# Patient Record
Sex: Female | Born: 1960 | Race: Black or African American | Hispanic: No | Marital: Married | Smoking: Never smoker
Health system: Southern US, Community
[De-identification: ages and names within clinical notes are randomized; demographics above are authoritative.]

## PROBLEM LIST (undated history)

## (undated) DIAGNOSIS — IMO0002 Reserved for concepts with insufficient information to code with codable children: Secondary | ICD-10-CM

## (undated) DIAGNOSIS — Z8619 Personal history of other infectious and parasitic diseases: Secondary | ICD-10-CM

## (undated) DIAGNOSIS — I10 Essential (primary) hypertension: Secondary | ICD-10-CM

## (undated) DIAGNOSIS — E119 Type 2 diabetes mellitus without complications: Secondary | ICD-10-CM

## (undated) DIAGNOSIS — E7800 Pure hypercholesterolemia, unspecified: Secondary | ICD-10-CM

## (undated) DIAGNOSIS — Z87440 Personal history of urinary (tract) infections: Secondary | ICD-10-CM

## (undated) HISTORY — DX: Pure hypercholesterolemia, unspecified: E78.00

## (undated) HISTORY — DX: Personal history of urinary (tract) infections: Z87.440

## (undated) HISTORY — DX: Reserved for concepts with insufficient information to code with codable children: IMO0002

## (undated) HISTORY — DX: Personal history of other infectious and parasitic diseases: Z86.19

## (undated) HISTORY — DX: Essential (primary) hypertension: I10

## (undated) HISTORY — DX: Type 2 diabetes mellitus without complications: E11.9

---

## 2003-04-07 HISTORY — PX: CHOLECYSTECTOMY: SHX55

## 2004-01-15 ENCOUNTER — Emergency Department: Payer: Self-pay | Admitting: General Practice

## 2004-04-06 HISTORY — PX: ABDOMINAL HYSTERECTOMY: SHX81

## 2004-07-16 ENCOUNTER — Emergency Department: Payer: Self-pay | Admitting: Emergency Medicine

## 2004-07-18 ENCOUNTER — Inpatient Hospital Stay: Payer: Self-pay | Admitting: Internal Medicine

## 2004-08-22 ENCOUNTER — Ambulatory Visit: Payer: Self-pay | Admitting: Internal Medicine

## 2004-11-18 ENCOUNTER — Emergency Department: Payer: Self-pay | Admitting: Emergency Medicine

## 2005-05-12 ENCOUNTER — Ambulatory Visit: Payer: Self-pay | Admitting: Internal Medicine

## 2005-06-20 ENCOUNTER — Emergency Department: Payer: Self-pay | Admitting: Emergency Medicine

## 2006-07-28 ENCOUNTER — Ambulatory Visit: Payer: Self-pay | Admitting: Internal Medicine

## 2006-12-30 ENCOUNTER — Emergency Department: Payer: Self-pay

## 2007-01-07 ENCOUNTER — Ambulatory Visit: Payer: Self-pay | Admitting: Internal Medicine

## 2007-08-25 ENCOUNTER — Ambulatory Visit: Payer: Self-pay | Admitting: Internal Medicine

## 2007-09-06 ENCOUNTER — Ambulatory Visit: Payer: Self-pay | Admitting: Internal Medicine

## 2007-10-12 ENCOUNTER — Ambulatory Visit: Payer: Self-pay | Admitting: Internal Medicine

## 2007-10-14 ENCOUNTER — Ambulatory Visit: Payer: Self-pay | Admitting: Internal Medicine

## 2009-10-30 ENCOUNTER — Ambulatory Visit: Payer: Self-pay | Admitting: Internal Medicine

## 2010-04-06 HISTORY — PX: BREAST SURGERY: SHX581

## 2010-04-06 HISTORY — PX: BREAST BIOPSY: SHX20

## 2010-11-20 ENCOUNTER — Ambulatory Visit: Payer: Self-pay | Admitting: Family Medicine

## 2010-11-25 ENCOUNTER — Ambulatory Visit: Payer: Self-pay | Admitting: Family Medicine

## 2011-01-05 ENCOUNTER — Ambulatory Visit: Payer: Self-pay | Admitting: Surgery

## 2012-01-28 ENCOUNTER — Emergency Department: Payer: Self-pay | Admitting: Emergency Medicine

## 2012-01-28 LAB — URINALYSIS, COMPLETE
Bacteria: NONE SEEN
Bilirubin,UR: NEGATIVE
Blood: NEGATIVE
Glucose,UR: 150 mg/dL (ref 0–75)
Ketone: NEGATIVE
Nitrite: NEGATIVE
Protein: NEGATIVE
RBC,UR: 7 /HPF (ref 0–5)
Specific Gravity: 1.018 (ref 1.003–1.030)
Squamous Epithelial: 3
WBC UR: 7 /HPF (ref 0–5)

## 2012-01-28 LAB — CBC
HCT: 40.7 % (ref 35.0–47.0)
MCH: 28 pg (ref 26.0–34.0)
Platelet: 212 10*3/uL (ref 150–440)
RBC: 4.77 10*6/uL (ref 3.80–5.20)
RDW: 12.6 % (ref 11.5–14.5)

## 2012-01-28 LAB — TROPONIN I: Troponin-I: <0.02 ng/mL

## 2012-01-28 LAB — COMPREHENSIVE METABOLIC PANEL
Anion Gap: 6 — ABNORMAL LOW (ref 7–16)
Bilirubin,Total: 0.3 mg/dL (ref 0.2–1.0)
Chloride: 104 mmol/L (ref 98–107)
Co2: 29 mmol/L (ref 21–32)
Creatinine: 0.99 mg/dL (ref 0.60–1.30)
EGFR (African American): >60
EGFR (Non-African Amer.): >60
Glucose: 225 mg/dL — ABNORMAL HIGH (ref 65–99)
Osmolality: 284 (ref 275–301)
Potassium: 3.9 mmol/L (ref 3.5–5.1)
SGOT(AST): 31 U/L (ref 15–37)
SGPT (ALT): 49 U/L (ref 12–78)
Sodium: 139 mmol/L (ref 136–145)
Total Protein: 8 g/dL (ref 6.4–8.2)

## 2012-01-28 LAB — CK TOTAL AND CKMB (NOT AT ARMC)
CK, Total: 144 U/L (ref 21–215)
CK, Total: 139 U/L (ref 21–215)
CK-MB: 0.8 ng/mL (ref 0.5–3.6)
CK-MB: 0.9 ng/mL (ref 0.5–3.6)

## 2012-04-06 HISTORY — PX: LEG SURGERY: SHX1003

## 2012-04-08 ENCOUNTER — Ambulatory Visit: Payer: Self-pay | Admitting: Surgery

## 2012-04-08 LAB — CBC WITH DIFFERENTIAL/PLATELET
Basophil #: 0 10*3/uL (ref 0.0–0.1)
HGB: 14.7 g/dL (ref 12.0–16.0)
Lymphocyte #: 1.1 10*3/uL (ref 1.0–3.6)
Lymphocyte %: 18.7 %
MCH: 28.4 pg (ref 26.0–34.0)
MCHC: 33.8 g/dL (ref 32.0–36.0)
MCV: 84 fL (ref 80–100)
Monocyte #: 0.9 x10 3/mm (ref 0.2–0.9)
Neutrophil #: 3.6 10*3/uL (ref 1.4–6.5)
RBC: 5.17 10*6/uL (ref 3.80–5.20)
WBC: 5.8 10*3/uL (ref 3.6–11.0)

## 2012-04-08 LAB — BASIC METABOLIC PANEL
BUN: 17 mg/dL (ref 7–18)
Calcium, Total: 9.3 mg/dL (ref 8.5–10.1)
Chloride: 96 mmol/L — ABNORMAL LOW (ref 98–107)
Co2: 27 mmol/L (ref 21–32)
Creatinine: 1.53 mg/dL — ABNORMAL HIGH (ref 0.60–1.30)
EGFR (African American): 46 — ABNORMAL LOW
EGFR (Non-African Amer.): 40 — ABNORMAL LOW
Osmolality: 272 (ref 275–301)
Potassium: 4.7 mmol/L (ref 3.5–5.1)

## 2012-04-12 LAB — WOUND CULTURE

## 2012-06-01 ENCOUNTER — Ambulatory Visit: Payer: Self-pay | Admitting: Orthopedic Surgery

## 2012-06-17 ENCOUNTER — Ambulatory Visit: Payer: Self-pay | Admitting: Family Medicine

## 2012-07-05 ENCOUNTER — Ambulatory Visit: Payer: Self-pay | Admitting: Family Medicine

## 2012-08-04 ENCOUNTER — Ambulatory Visit: Payer: Self-pay | Admitting: Family Medicine

## 2012-08-15 ENCOUNTER — Ambulatory Visit: Payer: Self-pay | Admitting: Family Medicine

## 2012-11-15 ENCOUNTER — Encounter: Payer: Self-pay | Admitting: Internal Medicine

## 2012-11-15 ENCOUNTER — Ambulatory Visit (INDEPENDENT_AMBULATORY_CARE_PROVIDER_SITE_OTHER): Payer: 59 | Admitting: Internal Medicine

## 2012-11-15 VITALS — BP 108/80 | HR 71 | Temp 98.1°F | Ht 63.5 in | Wt 274.0 lb

## 2012-11-15 DIAGNOSIS — M25512 Pain in left shoulder: Secondary | ICD-10-CM

## 2012-11-15 DIAGNOSIS — M25519 Pain in unspecified shoulder: Secondary | ICD-10-CM

## 2012-11-15 DIAGNOSIS — E119 Type 2 diabetes mellitus without complications: Secondary | ICD-10-CM

## 2012-11-15 DIAGNOSIS — I10 Essential (primary) hypertension: Secondary | ICD-10-CM

## 2012-11-15 DIAGNOSIS — E7800 Pure hypercholesterolemia, unspecified: Secondary | ICD-10-CM

## 2012-11-15 MED ORDER — NYSTATIN 100000 UNIT/GM EX CREA: TOPICAL_CREAM | Freq: Two times a day (BID) | CUTANEOUS | Status: DC

## 2012-11-17 ENCOUNTER — Encounter: Payer: Self-pay | Admitting: Internal Medicine

## 2012-11-17 DIAGNOSIS — E7800 Pure hypercholesterolemia, unspecified: Secondary | ICD-10-CM | POA: Insufficient documentation

## 2012-11-17 DIAGNOSIS — E119 Type 2 diabetes mellitus without complications: Secondary | ICD-10-CM | POA: Insufficient documentation

## 2012-11-17 DIAGNOSIS — M25519 Pain in unspecified shoulder: Secondary | ICD-10-CM | POA: Insufficient documentation

## 2012-11-17 DIAGNOSIS — I10 Essential (primary) hypertension: Secondary | ICD-10-CM | POA: Insufficient documentation

## 2012-11-17 NOTE — Assessment & Plan Note (Signed)
Blood pressure under good control.  Same medication regimen.  Check metabolic panel.    

## 2012-11-17 NOTE — Assessment & Plan Note (Signed)
Sugars appear to be out of control.  Brought in no recorded sugar readings.  Will check metabolic panel and a1c.  Keep up to date with eye checks.  Review records.  See when last microalbumin/cr ratio checked.  Record sugars.  Get her back in soon to reassess.

## 2012-11-17 NOTE — Assessment & Plan Note (Signed)
Low cholesterol diet.  Check lipid panel.    

## 2012-11-17 NOTE — Progress Notes (Signed)
Subjective:    Patient ID: Denise Mathis, female    DOB: [birth year], 52 y.o.   MRN: [id number]  HPI 52 year old female with past history of diabetes, hypertension and hypercholesterolemia who comes in today to follow up on these issues as well as to establish care.  She has previously been seeing Dr Kathrene Alu and Dr Marguerite Olea.  Diagnosed with diabetes at age 51.  Has been to [facility].  States when first diagnosed, a1c 13.  Last check that she is aware of - a1c 8.0.  Is currently on metformin and Januvia.  Not able to increase metformin secondary to increased diarrhea.  She stopped the actos.  Had not been watching what she eats as well and not exercising.  States AM sugars averaging 170 and PM 200-250.  Did not bring in any recorded sugar readings.  She had a cyst on her right upper thigh.  Had cellulitis.  Was evaluated at [facility].  Had drained.  Doing better.  Still with increased leg pain.  Was evaluated at [facility].  Had xray.  Told inflamed.  Given prednisone.  Better.  Does have increased shoulder pain and limited rom now.  Takes advil - sometimes 6/day.     Past Medical History  Diagnosis Date  . History of chicken pox   . Diabetes mellitus without complication   . Ulcer   . Hypertension   . Hypercholesterolemia   . Hx: UTI (urinary tract infection)     Outpatient Encounter Prescriptions as of 11/15/2012  Medication Sig Dispense Refill  . ibuprofen (ADVIL,MOTRIN) 200 MG tablet Take 200 mg by mouth every 6 (six) hours as needed for pain.      Marland Kitchen lisinopril (PRINIVIL,ZESTRIL) 10 MG tablet Take 10 mg by mouth daily.      . metFORMIN (GLUCOPHAGE) 500 MG tablet Take 500 mg by mouth 2 (two) times daily with a meal.      . Naproxen Sodium (ALEVE PO) Take by mouth as needed.      . nystatin cream (MYCOSTATIN) Apply topically 2 (two) times daily.  30 g  0  . sitaGLIPtin (JANUVIA) 100 MG tablet Take 100 mg by mouth daily.       No facility-administered encounter medications on file as  of 11/15/2012.    Review of Systems Patient denies any headache, lightheadedness or dizziness.  No sinus or allergy symptoms.  No chest pain, tightness or palpitations.  No increased shortness of breath, cough or congestion.  No nausea or vomiting.  No acid reflux.  No abdominal pain or cramping.  No bowel change, such as diarrhea, constipation, BRBPR or melana.  No urine change.  Sugars as outlined.  Shoulder discomfort and limited rom as outlined.       Objective:   Physical Exam Filed Vitals:   11/15/12 1514  BP: 108/80  Pulse: 71  Temp: 98.1 F (36.7 C)   Blood pressure recheck:  40/50  51 year old female in no acute distress.   HEENT:  Nares- clear.  Oropharynx - without lesions. NECK:  Supple.  Nontender.  No audible bruit.  HEART:  Appears to be regular. LUNGS:  No crackles or wheezing audible.  Respirations even and unlabored.  RADIAL PULSE:  Equal bilaterally.    ABDOMEN:  Soft, nontender.  Bowel sounds present and normal.  No audible abdominal bruit.   EXTREMITIES:  No increased edema present.  DP pulses palpable and equal bilaterally.      FEET:  No lesions.  Assessment & Plan:  HEALTH MAINTENANCE.  Will schedule her for a physical when due.  Obtain outside records for review.    I spent 45 minutes with the patient and more than 50% of the time was spent in consultation regarding the above.

## 2012-11-17 NOTE — Assessment & Plan Note (Signed)
Increased shoulder pain.  Limited rom.  Will xray.  May need referral to ortho and PT.

## 2012-11-21 ENCOUNTER — Ambulatory Visit (INDEPENDENT_AMBULATORY_CARE_PROVIDER_SITE_OTHER)
Admission: RE | Admit: 2012-11-21 | Discharge: 2012-11-21 | Disposition: A | Discharge status: Home / Self Care | Payer: 59 | Source: Ambulatory Visit | Attending: Internal Medicine | Admitting: Internal Medicine

## 2012-11-21 DIAGNOSIS — M25512 Pain in left shoulder: Secondary | ICD-10-CM

## 2012-11-21 DIAGNOSIS — M25519 Pain in unspecified shoulder: Secondary | ICD-10-CM

## 2012-11-22 ENCOUNTER — Encounter: Payer: Self-pay | Admitting: Internal Medicine

## 2012-11-23 ENCOUNTER — Encounter: Payer: Self-pay | Admitting: Internal Medicine

## 2012-11-23 DIAGNOSIS — M25512 Pain in left shoulder: Secondary | ICD-10-CM

## 2012-11-24 ENCOUNTER — Encounter: Payer: Self-pay | Admitting: Internal Medicine

## 2012-11-24 NOTE — Telephone Encounter (Signed)
Order placed for referral to ortho 

## 2012-11-29 ENCOUNTER — Encounter: Payer: Self-pay | Admitting: Internal Medicine

## 2012-11-29 ENCOUNTER — Encounter: Payer: Self-pay | Admitting: *Deleted

## 2012-11-30 ENCOUNTER — Encounter: Payer: Self-pay | Admitting: Internal Medicine

## 2012-12-01 ENCOUNTER — Encounter: Payer: Self-pay | Admitting: Internal Medicine

## 2012-12-07 ENCOUNTER — Encounter: Payer: Self-pay | Admitting: Internal Medicine

## 2012-12-07 ENCOUNTER — Ambulatory Visit (INDEPENDENT_AMBULATORY_CARE_PROVIDER_SITE_OTHER): Payer: 59 | Admitting: Internal Medicine

## 2012-12-07 ENCOUNTER — Other Ambulatory Visit: Payer: Self-pay | Admitting: Orthopedic Surgery

## 2012-12-07 VITALS — BP 118/76 | HR 63 | Temp 98.2°F | Resp 12 | Ht 63.5 in | Wt 273.0 lb

## 2012-12-07 DIAGNOSIS — M25512 Pain in left shoulder: Secondary | ICD-10-CM

## 2012-12-07 DIAGNOSIS — E119 Type 2 diabetes mellitus without complications: Secondary | ICD-10-CM

## 2012-12-07 DIAGNOSIS — I10 Essential (primary) hypertension: Secondary | ICD-10-CM

## 2012-12-07 DIAGNOSIS — E7800 Pure hypercholesterolemia, unspecified: Secondary | ICD-10-CM

## 2012-12-07 DIAGNOSIS — M25519 Pain in unspecified shoulder: Secondary | ICD-10-CM

## 2012-12-07 MED ORDER — GLIPIZIDE ER 5 MG PO TB24: 5.0000 mg | ORAL_TABLET | Freq: Every day | ORAL | Status: DC

## 2012-12-09 ENCOUNTER — Other Ambulatory Visit: Payer: 59

## 2012-12-10 ENCOUNTER — Encounter: Payer: Self-pay | Admitting: Internal Medicine

## 2012-12-10 NOTE — Assessment & Plan Note (Signed)
A1c just checked 11/17/12 - 11.9.  Unable to increase metformin.  She is on Januvia.  Previously had problems with lows - with sulfonylurea.  Discussed starting Victoza or Byetta.  Discussed side effect of weight loss.  She declines to start.  Does not want to give herself an injection.  Will start Glucotrol XL 5mg  q day.  Adjust diet and exercise as we discussed.  She has been to [facility].  Does not feel she needs a refresher.  Check blood sugar and record at least bid.  Get her back in soon to reassess.  Keep up to date with eye checks.

## 2012-12-10 NOTE — Assessment & Plan Note (Signed)
Increased shoulder pain.  Limited rom.  Xray per report.  Referred to ortho for further evaluation and treatment.

## 2012-12-10 NOTE — Assessment & Plan Note (Signed)
Blood pressure under good control.  Same medication regimen.  Follow metabolic panel.   

## 2012-12-10 NOTE — Progress Notes (Signed)
  Subjective:    Patient ID: Denise Mathis, female    DOB: 05/07/1960, 52 y.o.   MRN: [id number]  HPI 52 year old female with past history of diabetes, hypertension and hypercholesterolemia who comes in today for a scheduled follow up.  Diagnosed with diabetes at age 54.  Has been to [facility].  Is currently on metformin and Januvia.  Not able to increase metformin secondary to increased diarrhea. She stopped the actos.  Since her last visit ,she has been watching what she eats.  Has cut out some sweets and drinking water, etc.  Plans to start exercising more.  Breathing stable.  No cardiac symptoms  With increased activity or exertion.      Past Medical History  Diagnosis Date  . History of chicken pox   . Diabetes mellitus without complication   . Ulcer   . Hypertension   . Hypercholesterolemia   . Hx: UTI (urinary tract infection)     Outpatient Encounter Prescriptions as of 12/07/2012  Medication Sig Dispense Refill  . ibuprofen (ADVIL,MOTRIN) 200 MG tablet Take 200 mg by mouth every 6 (six) hours as needed for pain.      Marland Kitchen lisinopril (PRINIVIL,ZESTRIL) 10 MG tablet Take 10 mg by mouth daily.      . metFORMIN (GLUCOPHAGE) 500 MG tablet Take 500 mg by mouth 2 (two) times daily with a meal.      . nystatin cream (MYCOSTATIN) Apply topically 2 (two) times daily.  30 g  0  . sitaGLIPtin (JANUVIA) 100 MG tablet Take 100 mg by mouth daily.      Marland Kitchen glipiZIDE (GLUCOTROL XL) 5 MG 24 hr tablet Take 1 tablet (5 mg total) by mouth daily.  30 tablet  2  . [DISCONTINUED] Naproxen Sodium (ALEVE PO) Take by mouth as needed.       No facility-administered encounter medications on file as of 12/07/2012.    Review of Systems Patient denies any headache, lightheadedness or dizziness.  No sinus or allergy symptoms.  No chest pain, tightness or palpitations.  No increased shortness of breath, cough or congestion.  No nausea or vomiting.  No acid reflux.  No abdominal pain or cramping.  No bowel change, such  as diarrhea, constipation, BRBPR or melana.  No urine change.  Brought in no recorded sugar readings.  She states still elevated, but have started coming down.  She has adjusted her diet.       Objective:   Physical Exam  Filed Vitals:   12/07/12 1211  BP: 118/76  Pulse: 63  Temp: 98.2 F (36.8 C)  Resp: 81   52 year old female in no acute distress.   HEENT:  Nares- clear.  Oropharynx - without lesions. NECK:  Supple.  Nontender.  No audible bruit.  HEART:  Appears to be regular. LUNGS:  No crackles or wheezing audible.  Respirations even and unlabored.  RADIAL PULSE:  Equal bilaterally.    ABDOMEN:  Soft, nontender.  Bowel sounds present and normal.  No audible abdominal bruit.   EXTREMITIES:  No increased edema present.  DP pulses palpable and equal bilaterally.      FEET:  No lesions.      Assessment & Plan:  HEALTH MAINTENANCE.  Will schedule her for a physical when due.  Obtain outside records for review.

## 2012-12-10 NOTE — Assessment & Plan Note (Signed)
Low cholesterol diet.  Cholesterol just checked 11/21/12 revealed total cholesterol 183, triglycerides 119, HDL 46 and LDL 113.

## 2012-12-16 ENCOUNTER — Encounter: Payer: Self-pay | Admitting: *Deleted

## 2012-12-17 ENCOUNTER — Ambulatory Visit
Admission: RE | Admit: 2012-12-17 | Discharge: 2012-12-17 | Disposition: A | Discharge status: Home / Self Care | Payer: 59 | Source: Ambulatory Visit | Attending: Orthopedic Surgery | Admitting: Orthopedic Surgery

## 2012-12-17 DIAGNOSIS — M25512 Pain in left shoulder: Secondary | ICD-10-CM

## 2012-12-20 ENCOUNTER — Telehealth: Payer: Self-pay | Admitting: *Deleted

## 2012-12-20 ENCOUNTER — Encounter: Payer: Self-pay | Admitting: Internal Medicine

## 2012-12-20 DIAGNOSIS — R945 Abnormal results of liver function studies: Secondary | ICD-10-CM

## 2012-12-20 NOTE — Telephone Encounter (Signed)
Pt would like to proceed with Abdominal Ultrasound (based on labs)

## 2012-12-20 NOTE — Telephone Encounter (Signed)
Order placed for abdominal ultrasound.   

## 2012-12-23 ENCOUNTER — Ambulatory Visit: Payer: Self-pay | Admitting: Internal Medicine

## 2012-12-28 ENCOUNTER — Telehealth: Payer: Self-pay | Admitting: Internal Medicine

## 2012-12-28 DIAGNOSIS — R160 Hepatomegaly, not elsewhere classified: Secondary | ICD-10-CM

## 2012-12-28 DIAGNOSIS — K760 Fatty (change of) liver, not elsewhere classified: Secondary | ICD-10-CM

## 2012-12-30 NOTE — Telephone Encounter (Signed)
Order placed for referral to GI 

## 2013-01-03 ENCOUNTER — Encounter: Payer: Self-pay | Admitting: Internal Medicine

## 2013-01-04 ENCOUNTER — Encounter: Payer: Self-pay | Admitting: Internal Medicine

## 2013-01-04 ENCOUNTER — Ambulatory Visit (INDEPENDENT_AMBULATORY_CARE_PROVIDER_SITE_OTHER): Payer: 59 | Admitting: Internal Medicine

## 2013-01-04 VITALS — BP 110/70 | HR 72 | Temp 98.2°F | Ht 63.5 in | Wt 265.5 lb

## 2013-01-04 DIAGNOSIS — I10 Essential (primary) hypertension: Secondary | ICD-10-CM

## 2013-01-04 DIAGNOSIS — R945 Abnormal results of liver function studies: Secondary | ICD-10-CM

## 2013-01-04 DIAGNOSIS — E7800 Pure hypercholesterolemia, unspecified: Secondary | ICD-10-CM

## 2013-01-04 DIAGNOSIS — E119 Type 2 diabetes mellitus without complications: Secondary | ICD-10-CM

## 2013-01-04 DIAGNOSIS — M25519 Pain in unspecified shoulder: Secondary | ICD-10-CM

## 2013-01-04 DIAGNOSIS — R7989 Other specified abnormal findings of blood chemistry: Secondary | ICD-10-CM

## 2013-01-04 MED ORDER — LISINOPRIL 10 MG PO TABS: 10.0000 mg | ORAL_TABLET | Freq: Every day | ORAL | Status: DC

## 2013-01-04 MED ORDER — GLIPIZIDE ER 5 MG PO TB24: 5.0000 mg | ORAL_TABLET | Freq: Every day | ORAL | Status: DC

## 2013-01-04 MED ORDER — SITAGLIPTIN PHOSPHATE 100 MG PO TABS: 100.0000 mg | ORAL_TABLET | Freq: Every day | ORAL | Status: DC

## 2013-01-04 MED ORDER — METFORMIN HCL 500 MG PO TABS: 500.0000 mg | ORAL_TABLET | Freq: Two times a day (BID) | ORAL | Status: DC

## 2013-01-04 NOTE — Progress Notes (Signed)
  Subjective:    Patient ID: Denise Mathis, female    DOB: 1961-03-12, 52 y.o.   MRN: [id number]  HPI 52 year old female with past history of diabetes, hypertension and hypercholesterolemia who comes in today for a scheduled follow up.  Diagnosed with diabetes at age 17.  Has been to [facility].  Since her last visit ,she has been watching what she eats.  Exercising.  Losing weight.  Feels better.  sugars have improved significantly.  See attached list for details.  AM sugars averaging 90s-120 and PM sugars 120-140s.   Breathing stable.  No cardiac symptoms  With increased activity or exertion.  Saw ortho regarding her shoulder.  MRI performed.  See report for details.  Rotator cuff problems.  planning to start physical therapy next week.  Is s/p injection.  Pain is better.      Past Medical History  Diagnosis Date  . History of chicken pox   . Diabetes mellitus without complication   . Ulcer   . Hypertension   . Hypercholesterolemia   . Hx: UTI (urinary tract infection)     Outpatient Encounter Prescriptions as of 01/04/2013  Medication Sig Dispense Refill  . glipiZIDE (GLUCOTROL XL) 5 MG 24 hr tablet Take 1 tablet (5 mg total) by mouth daily.  30 tablet  2  . lisinopril (PRINIVIL,ZESTRIL) 10 MG tablet Take 10 mg by mouth daily.      . metFORMIN (GLUCOPHAGE) 500 MG tablet Take 500 mg by mouth 2 (two) times daily with a meal.      . nystatin cream (MYCOSTATIN) Apply topically 2 (two) times daily.  30 g  0  . sitaGLIPtin (JANUVIA) 100 MG tablet Take 100 mg by mouth daily.      . [DISCONTINUED] ibuprofen (ADVIL,MOTRIN) 200 MG tablet Take 200 mg by mouth every 6 (six) hours as needed for pain.       No facility-administered encounter medications on file as of 01/04/2013.    Review of Systems Patient denies any headache, lightheadedness or dizziness.  No sinus or allergy symptoms.  No chest pain, tightness or palpitations.  No increased shortness of breath, cough or congestion.  No nausea or  vomiting.  No acid reflux.  No abdominal pain or cramping.  No bowel change, such as diarrhea, constipation, BRBPR or melana.  No urine change.  Brought in recorded sugar readings.  Improved.  See attached list for details.   She has adjusted her diet.  Exercising.  Losing weight.  Shoulder is better.       Objective:   Physical Exam  Filed Vitals:   01/04/13 1009  BP: 110/70  Pulse: 72  Temp: 98.2 F (46.45 C)   52 year old female in no acute distress.   HEENT:  Nares- clear.  Oropharynx - without lesions. NECK:  Supple.  Nontender.  No audible bruit.  HEART:  Appears to be regular. LUNGS:  No crackles or wheezing audible.  Respirations even and unlabored.  RADIAL PULSE:  Equal bilaterally.    ABDOMEN:  Soft, nontender.  Bowel sounds present and normal.  No audible abdominal bruit.   EXTREMITIES:  No increased edema present.  DP pulses palpable and equal bilaterally.      FEET:  No lesions.      Assessment & Plan:  HEALTH MAINTENANCE.  Will schedule her for a physical when due. Still need outside records.

## 2013-01-04 NOTE — Assessment & Plan Note (Signed)
Low cholesterol diet.  Cholesterol just checked 11/21/12 revealed total cholesterol 183, triglycerides 119, HDL 46 and LDL 113.

## 2013-01-04 NOTE — Assessment & Plan Note (Addendum)
Seeing ortho.  S/p injection.  Pain is better.  Planning for physical therapy.  Doing exercises at home.

## 2013-01-04 NOTE — Assessment & Plan Note (Signed)
Blood pressure under good control.  Same medication regimen.  Follow metabolic panel.   

## 2013-01-04 NOTE — Assessment & Plan Note (Addendum)
Sugars improved significantly.  See her attached list.  Is watching what she eats and exercising.  Feels better.  Had her eyes checked in 4/14.  Same medication regimen.  Follow metabolic panel and a1c.

## 2013-01-05 ENCOUNTER — Encounter: Payer: Self-pay | Admitting: Internal Medicine

## 2013-01-05 DIAGNOSIS — R945 Abnormal results of liver function studies: Secondary | ICD-10-CM | POA: Insufficient documentation

## 2013-01-05 NOTE — Assessment & Plan Note (Signed)
Liver function elevated but stable.  Ultrasound revealed fatty liver and some hepatomegaly.  Referred to GI for further evaluation and treatment.

## 2013-01-09 ENCOUNTER — Other Ambulatory Visit: Payer: Self-pay | Admitting: *Deleted

## 2013-01-09 ENCOUNTER — Encounter: Payer: Self-pay | Admitting: Internal Medicine

## 2013-01-09 MED ORDER — GLUCOSE BLOOD VI STRP: ORAL_STRIP | Status: AC

## 2013-01-09 MED ORDER — ONETOUCH LANCETS MISC: Status: AC

## 2013-02-22 ENCOUNTER — Encounter: Payer: Self-pay | Admitting: Emergency Medicine

## 2013-03-09 ENCOUNTER — Ambulatory Visit: Payer: 59 | Admitting: Internal Medicine

## 2013-04-20 ENCOUNTER — Ambulatory Visit (INDEPENDENT_AMBULATORY_CARE_PROVIDER_SITE_OTHER): Payer: 59 | Admitting: Internal Medicine

## 2013-04-20 ENCOUNTER — Telehealth: Payer: Self-pay | Admitting: *Deleted

## 2013-04-20 ENCOUNTER — Encounter: Payer: Self-pay | Admitting: Internal Medicine

## 2013-04-20 VITALS — BP 120/70 | HR 86 | Temp 98.2°F | Ht 63.5 in | Wt 268.5 lb

## 2013-04-20 DIAGNOSIS — E119 Type 2 diabetes mellitus without complications: Secondary | ICD-10-CM

## 2013-04-20 DIAGNOSIS — R5381 Other malaise: Secondary | ICD-10-CM

## 2013-04-20 DIAGNOSIS — I10 Essential (primary) hypertension: Secondary | ICD-10-CM

## 2013-04-20 DIAGNOSIS — R7989 Other specified abnormal findings of blood chemistry: Secondary | ICD-10-CM

## 2013-04-20 DIAGNOSIS — M25519 Pain in unspecified shoulder: Secondary | ICD-10-CM

## 2013-04-20 DIAGNOSIS — E7800 Pure hypercholesterolemia, unspecified: Secondary | ICD-10-CM

## 2013-04-20 DIAGNOSIS — R112 Nausea with vomiting, unspecified: Secondary | ICD-10-CM

## 2013-04-20 DIAGNOSIS — R945 Abnormal results of liver function studies: Secondary | ICD-10-CM

## 2013-04-20 DIAGNOSIS — R5383 Other fatigue: Secondary | ICD-10-CM

## 2013-04-20 LAB — CBC WITH DIFFERENTIAL/PLATELET
Basophils Absolute: 0 10*3/uL (ref 0.0–0.1)
Basophils Relative: 0.3 % (ref 0.0–3.0)
Eosinophils Absolute: 0.1 10*3/uL (ref 0.0–0.7)
Eosinophils Relative: 1.6 % (ref 0.0–5.0)
HEMATOCRIT: 40.3 % (ref 36.0–46.0)
Hemoglobin: 13.3 g/dL (ref 12.0–15.0)
LYMPHS ABS: 2.5 10*3/uL (ref 0.7–4.0)
Lymphocytes Relative: 44.5 % (ref 12.0–46.0)
MCHC: 33.1 g/dL (ref 30.0–36.0)
MCV: 84.9 fl (ref 78.0–100.0)
MONO ABS: 0.4 10*3/uL (ref 0.1–1.0)
Monocytes Relative: 7.3 % (ref 3.0–12.0)
Neutro Abs: 2.6 10*3/uL (ref 1.4–7.7)
Neutrophils Relative %: 46.3 % (ref 43.0–77.0)
PLATELETS: 214 10*3/uL (ref 150.0–400.0)
RBC: 4.75 Mil/uL (ref 3.87–5.11)
RDW: 12.5 % (ref 11.5–14.6)
WBC: 5.6 10*3/uL (ref 4.5–10.5)

## 2013-04-20 LAB — TSH: TSH: 0.55 u[IU]/mL (ref 0.35–5.50)

## 2013-04-20 LAB — HEPATIC FUNCTION PANEL
ALK PHOS: 64 U/L (ref 39–117)
ALT: 55 U/L — ABNORMAL HIGH (ref 0–35)
AST: 40 U/L — ABNORMAL HIGH (ref 0–37)
Albumin: 3.8 g/dL (ref 3.5–5.2)
BILIRUBIN DIRECT: 0.1 mg/dL (ref 0.0–0.3)
TOTAL PROTEIN: 7 g/dL (ref 6.0–8.3)
Total Bilirubin: 0.5 mg/dL (ref 0.3–1.2)

## 2013-04-20 LAB — BASIC METABOLIC PANEL
BUN: 9 mg/dL (ref 6–23)
CHLORIDE: 102 meq/L (ref 96–112)
CO2: 28 meq/L (ref 19–32)
Calcium: 9.3 mg/dL (ref 8.4–10.5)
Creatinine, Ser: 0.9 mg/dL (ref 0.4–1.2)
GFR: 87.4 mL/min (ref >60.00)
Glucose, Bld: 199 mg/dL — ABNORMAL HIGH (ref 70–99)
POTASSIUM: 3.9 meq/L (ref 3.5–5.1)
Sodium: 136 mEq/L (ref 135–145)

## 2013-04-20 LAB — LIPID PANEL
CHOL/HDL RATIO: 4
CHOLESTEROL: 126 mg/dL (ref 0–200)
HDL: 32.9 mg/dL — ABNORMAL LOW (ref >39.00)
LDL Cholesterol: 61 mg/dL (ref 0–99)
Triglycerides: 160 mg/dL — ABNORMAL HIGH (ref 0.0–149.0)
VLDL: 32 mg/dL (ref 0.0–40.0)

## 2013-04-20 LAB — HEMOGLOBIN A1C: Hgb A1c MFr Bld: 10.2 % — ABNORMAL HIGH (ref 4.6–6.5)

## 2013-04-20 LAB — MICROALBUMIN / CREATININE URINE RATIO
CREATININE, U: 272.1 mg/dL
MICROALB/CREAT RATIO: 0.3 mg/g (ref 0.0–30.0)
Microalb, Ur: 0.7 mg/dL (ref 0.0–1.9)

## 2013-04-20 NOTE — Telephone Encounter (Signed)
I requested immunization records from [facility] & received a return form to inform us that they do not have any immunizations on record

## 2013-04-20 NOTE — Assessment & Plan Note (Signed)
Low cholesterol diet.  Check cholesterol panel.    

## 2013-04-20 NOTE — Assessment & Plan Note (Signed)
Blood pressure under good control.  Same medication regimen.  Follow metabolic panel.   

## 2013-04-20 NOTE — Progress Notes (Signed)
Pre-visit discussion using our clinic review tool. No additional management support is needed unless otherwise documented below in the visit note.  

## 2013-04-20 NOTE — Patient Instructions (Signed)
Start zantac 150mg  - one per day.  Take 30 minutes before breakfast.  Continue your diabetes and blood pressure medication - as you are doing.

## 2013-04-20 NOTE — Assessment & Plan Note (Addendum)
Seeing ortho.  S/p injection.  Pain is better.

## 2013-04-20 NOTE — Assessment & Plan Note (Signed)
Other family members or coworkers have been sick with the episodes of vomiting and diarrhea.  Better now.  Bland foods.  Follow closely.  Check cbc, metabolic panel today.  Will notify me if persistent.

## 2013-04-20 NOTE — Assessment & Plan Note (Addendum)
Liver function elevated but stable.  Ultrasound revealed fatty liver and some hepatomegaly.  Referred to GI for further evaluation and treatment.  Did not keep appt.  Recheck liver panel today.

## 2013-04-20 NOTE — Progress Notes (Signed)
Subjective:    Patient ID: Denise Mathis, female    DOB: 10/24/1960, 53 y.o.   MRN: [id number]  HPI 53 year old female with past history of diabetes, hypertension and hypercholesterolemia who comes in today for a scheduled follow up.  Diagnosed with diabetes at age 53.  Has been to [facility].  Sugars were doing well.  Reports has been having intermittent episodes of diarrhea and vomiting.  The most recent episode was this past week.  Her son and family members had the same thing.  The vomiting and diarrhea have stopped.  She is eating.  Discussed eating bland foods.  Two previous episodes were associated with sick co workers and her sick mother.  Lasted only 24 hours.  Her taste has not come back.  Feels better.  No abdominal pain and cramping.   Breathing stable.  No cardiac symptoms  with increased activity or exertion.  Saw ortho regarding her shoulder.  MRI performed.  See report for details.  Rotator cuff problems.  Went to therapy.  Brought in no recorded sugar readings.  When sick, elevated.  States when not sick am sugars averaging 98-130.       Past Medical History  Diagnosis Date  . History of chicken pox   . Diabetes mellitus without complication   . Ulcer   . Hypertension   . Hypercholesterolemia   . Hx: UTI (urinary tract infection)     Outpatient Encounter Prescriptions as of 04/20/2013  Medication Sig  . glipiZIDE (GLUCOTROL XL) 5 MG 24 hr tablet Take 1 tablet (5 mg total) by mouth daily.  Marland Kitchen. glucose blood test strip Use as instructed  . lisinopril (PRINIVIL,ZESTRIL) 10 MG tablet Take 1 tablet (10 mg total) by mouth daily.  . metFORMIN (GLUCOPHAGE) 500 MG tablet Take 1 tablet (500 mg total) by mouth 2 (two) times daily with a meal.  . nystatin cream (MYCOSTATIN) Apply topically 2 (two) times daily.  . ONE TOUCH LANCETS MISC Use as instructed  . sitaGLIPtin (JANUVIA) 100 MG tablet Take 1 tablet (100 mg total) by mouth daily.    Review of Systems Patient denies any headache,  lightheadedness or dizziness.  No sinus or allergy symptoms.  No chest pain, tightness or palpitations.  No increased shortness of breath, cough or congestion.  No nausea or vomiting now.  No acid reflux.  History of ulcer disease.  Some stomach irritation.  No abdominal pain or cramping.  No BRBPR or melana.  Diarrhea better.  No urine change.  Brought in no recorded sugar readings.  Stomach issues as outlined.       Objective:   Physical Exam  Filed Vitals:   04/20/13 0856  BP: 120/70  Pulse: 86  Temp: 98.2 F (2536.758 C)   53 year old female in no acute distress.   HEENT:  Nares- clear.  Oropharynx - without lesions. NECK:  Supple.  Nontender.  No audible bruit.  HEART:  Appears to be regular. LUNGS:  No crackles or wheezing audible.  Respirations even and unlabored.  RADIAL PULSE:  Equal bilaterally.    ABDOMEN:  Soft, nontender.  Bowel sounds present and normal.  No audible abdominal bruit.   EXTREMITIES:  No increased edema present.  DP pulses palpable and equal bilaterally.      FEET:  No lesions.      Assessment & Plan:  HEALTH MAINTENANCE.  Schedule a physical next visit.  States had mammogram in 3/15.   Still need outside records.

## 2013-04-20 NOTE — Assessment & Plan Note (Addendum)
Has not been watching what she eats.  Not exercising.   Had her eyes checked in 4/14.  Brought in no recorded sugar readings.  Same medication regimen for now.  Check blood sugars at least bid.  Get back in to exercising and watching what she eats.  Check metabolic panel and a1c.

## 2013-04-21 ENCOUNTER — Other Ambulatory Visit: Payer: Self-pay | Admitting: Internal Medicine

## 2013-04-21 DIAGNOSIS — R109 Unspecified abdominal pain: Secondary | ICD-10-CM

## 2013-04-21 DIAGNOSIS — R7989 Other specified abnormal findings of blood chemistry: Secondary | ICD-10-CM

## 2013-04-21 DIAGNOSIS — R945 Abnormal results of liver function studies: Secondary | ICD-10-CM

## 2013-04-21 NOTE — Progress Notes (Signed)
Order placed for abdominal ultrasound.   

## 2013-04-26 ENCOUNTER — Ambulatory Visit: Payer: Self-pay | Admitting: Internal Medicine

## 2013-05-05 ENCOUNTER — Ambulatory Visit: Payer: 59 | Admitting: Internal Medicine

## 2013-05-05 ENCOUNTER — Telehealth: Payer: Self-pay | Admitting: Internal Medicine

## 2013-05-05 NOTE — Telephone Encounter (Signed)
Pt left message to r/s appointment  05/03/13  Left message for pt to call office

## 2013-05-09 ENCOUNTER — Encounter: Payer: Self-pay | Admitting: Internal Medicine

## 2013-05-11 ENCOUNTER — Encounter: Payer: Self-pay | Admitting: *Deleted

## 2013-06-01 ENCOUNTER — Ambulatory Visit: Payer: 59 | Admitting: Internal Medicine

## 2013-06-02 ENCOUNTER — Encounter: Payer: Self-pay | Admitting: *Deleted

## 2013-06-12 ENCOUNTER — Emergency Department: Payer: Self-pay | Admitting: Emergency Medicine

## 2013-06-12 LAB — BASIC METABOLIC PANEL
Anion Gap: 4 — ABNORMAL LOW (ref 7–16)
BUN: 11 mg/dL (ref 7–18)
CO2: 29 mmol/L (ref 21–32)
Calcium, Total: 9.5 mg/dL (ref 8.5–10.1)
Chloride: 100 mmol/L (ref 98–107)
Creatinine: 0.92 mg/dL (ref 0.60–1.30)
EGFR (African American): >60
EGFR (Non-African Amer.): >60
GLUCOSE: 234 mg/dL — AB (ref 65–99)
Osmolality: 273 (ref 275–301)
POTASSIUM: 3.8 mmol/L (ref 3.5–5.1)
Sodium: 133 mmol/L — ABNORMAL LOW (ref 136–145)

## 2013-06-12 LAB — CBC
HCT: 44.4 % (ref 35.0–47.0)
HGB: 14.5 g/dL (ref 12.0–16.0)
MCH: 28.2 pg (ref 26.0–34.0)
MCHC: 32.6 g/dL (ref 32.0–36.0)
MCV: 87 fL (ref 80–100)
Platelet: 223 10*3/uL (ref 150–440)
RBC: 5.13 10*6/uL (ref 3.80–5.20)
RDW: 12.9 % (ref 11.5–14.5)
WBC: 7.5 10*3/uL (ref 3.6–11.0)

## 2013-06-12 LAB — URINALYSIS, COMPLETE
BILIRUBIN, UR: NEGATIVE
Blood: NEGATIVE
Ketone: NEGATIVE
NITRITE: NEGATIVE
PH: 5 (ref 4.5–8.0)
Protein: NEGATIVE
SPECIFIC GRAVITY: 1.024 (ref 1.003–1.030)

## 2013-06-23 ENCOUNTER — Ambulatory Visit (INDEPENDENT_AMBULATORY_CARE_PROVIDER_SITE_OTHER): Payer: 59 | Admitting: Internal Medicine

## 2013-06-23 ENCOUNTER — Encounter: Payer: Self-pay | Admitting: Internal Medicine

## 2013-06-23 ENCOUNTER — Other Ambulatory Visit: Payer: Self-pay | Admitting: Internal Medicine

## 2013-06-23 VITALS — BP 110/80 | HR 77 | Temp 98.2°F | Ht 63.5 in | Wt 269.0 lb

## 2013-06-23 DIAGNOSIS — R7989 Other specified abnormal findings of blood chemistry: Secondary | ICD-10-CM

## 2013-06-23 DIAGNOSIS — N760 Acute vaginitis: Secondary | ICD-10-CM

## 2013-06-23 DIAGNOSIS — R945 Abnormal results of liver function studies: Secondary | ICD-10-CM

## 2013-06-23 DIAGNOSIS — R112 Nausea with vomiting, unspecified: Secondary | ICD-10-CM

## 2013-06-23 DIAGNOSIS — E7800 Pure hypercholesterolemia, unspecified: Secondary | ICD-10-CM

## 2013-06-23 DIAGNOSIS — I10 Essential (primary) hypertension: Secondary | ICD-10-CM

## 2013-06-23 DIAGNOSIS — E119 Type 2 diabetes mellitus without complications: Secondary | ICD-10-CM

## 2013-06-23 MED ORDER — METFORMIN HCL 1000 MG PO TABS: 1000.0000 mg | ORAL_TABLET | Freq: Two times a day (BID) | ORAL | Status: DC

## 2013-06-23 NOTE — Patient Instructions (Signed)
Take 1000mg  of metformin in the morning and with your evening meal.  Take Glipizide 5mg  - in the morning.   Start vitamin E 400 IU one per day.

## 2013-06-23 NOTE — Assessment & Plan Note (Addendum)
Has not been watching what she eats.  Not exercising.   Had her eyes checked in 4/14.  Check blood sugars at least bid.  Get back in to exercising and watching what she eats.  Check metabolic panel and a1c.  Increase metformin to 1000mg  bid.  Change glucotrol to the am.  Get her back in soon to reassess.

## 2013-06-23 NOTE — Progress Notes (Signed)
Subjective:    Patient ID: Denise Mathis, female    DOB: 08/07/1960, 53 y.o.   MRN: [id number]  HPI 53 year old female with past history of diabetes, hypertension and hypercholesterolemia who comes in today for a scheduled follow up.  Here to discuss her sugars.  Missed last appt.  Diagnosed with diabetes at age 53.  Has been to [facility].  Reports has been having intermittent episodes of diarrhea and vomiting.  See last note for details.  Was seen in the ER since her last visit.  Had pain in her back.  Given a muscle relaxer.  W/up unrevealing.   The vomiting and diarrhea have stopped for now.  Has had no episodes in march.    She is eating.   Not watching what she eats.   No abdominal pain and cramping.   Breathing stable.  No cardiac symptoms  with increased activity or exertion.  Saw ortho regarding her shoulder.  MRI performed.  See report for details.  Rotator cuff problems.  Went to therapy.  Not watching her diet.  Not exercising.  Sugars elevated.        Past Medical History  Diagnosis Date  . History of chicken pox   . Diabetes mellitus without complication   . Ulcer   . Hypertension   . Hypercholesterolemia   . Hx: UTI (urinary tract infection)     Outpatient Encounter Prescriptions as of 06/23/2013  Medication Sig  . glipiZIDE (GLUCOTROL XL) 5 MG 24 hr tablet Take 1 tablet (5 mg total) by mouth daily.  Marland Kitchen. glucose blood test strip Use as instructed  . lisinopril (PRINIVIL,ZESTRIL) 10 MG tablet Take 1 tablet (10 mg total) by mouth daily.  . metFORMIN (GLUCOPHAGE) 500 MG tablet Take 1 tablet (500 mg total) by mouth 2 (two) times daily with a meal.  . nystatin cream (MYCOSTATIN) Apply topically 2 (two) times daily.  . ONE TOUCH LANCETS MISC Use as instructed  . sitaGLIPtin (JANUVIA) 100 MG tablet Take 1 tablet (100 mg total) by mouth daily.    Review of Systems Patient denies any headache, lightheadedness or dizziness.  No sinus or allergy symptoms.  No chest pain, tightness or  palpitations.  No increased shortness of breath, cough or congestion.  No nausea or vomiting now.  No acid reflux.  History of ulcer disease.   No abdominal pain or cramping.  No BRBPR or melana.  Diarrhea better.  No urine change.  not watching her diet.  Not exercising.  Sugars poorly controlled.  Some intermittent vaginal spotting.  Is s/p hysterectomy.  No intercourse.         Objective:   Physical Exam  Filed Vitals:   06/23/13 1510  BP: 110/80  Pulse: 77  Temp: 98.2 F (3536.488 C)   53 year old female in no acute distress.   HEENT:  Nares- clear.  Oropharynx - without lesions. NECK:  Supple.  Nontender.  No audible bruit.  HEART:  Appears to be regular. LUNGS:  No crackles or wheezing audible.  Respirations even and unlabored.  RADIAL PULSE:  Equal bilaterally.    ABDOMEN:  Soft, nontender.  Bowel sounds present and normal.  No audible abdominal bruit.  GU:  Normal external genitalia.  Vaginal vault without lesions.  S/p hysterectomy.  Atrophy changes present.  Could not appreciate any adnexal masses or tenderness.    EXTREMITIES:  No increased edema present.  DP pulses palpable and equal bilaterally.      FEET:  No lesions.      Assessment & Plan:  HEALTH MAINTENANCE.  Schedule a physical next visit.

## 2013-06-25 ENCOUNTER — Encounter: Payer: Self-pay | Admitting: Internal Medicine

## 2013-06-25 NOTE — Assessment & Plan Note (Signed)
Low cholesterol diet.  Check cholesterol panel.    

## 2013-06-25 NOTE — Assessment & Plan Note (Signed)
Liver function elevated but stable.  Ultrasound revealed fatty liver and some hepatomegaly.  Referred to GI for further evaluation and treatment.  Did not keep appt.  Recheck liver panel with next labs.  Refer back to GI.

## 2013-06-25 NOTE — Assessment & Plan Note (Signed)
Blood pressure under good control.  Same medication regimen.  Follow metabolic panel.   

## 2013-06-25 NOTE — Assessment & Plan Note (Signed)
Has had intermittent episodes of nausea and vomiting.  See previous note for details.  Has not had episode in march.  Abdominal us as outlined.  Given persistent intermittent episodes, needs GI referral for evaluation.

## 2013-06-26 ENCOUNTER — Encounter: Payer: Self-pay | Admitting: Emergency Medicine

## 2013-06-27 ENCOUNTER — Telehealth: Payer: Self-pay | Admitting: *Deleted

## 2013-06-27 DIAGNOSIS — R7989 Other specified abnormal findings of blood chemistry: Secondary | ICD-10-CM

## 2013-06-27 DIAGNOSIS — I10 Essential (primary) hypertension: Secondary | ICD-10-CM

## 2013-06-27 DIAGNOSIS — E7800 Pure hypercholesterolemia, unspecified: Secondary | ICD-10-CM

## 2013-06-27 DIAGNOSIS — E119 Type 2 diabetes mellitus without complications: Secondary | ICD-10-CM

## 2013-06-27 DIAGNOSIS — R945 Abnormal results of liver function studies: Secondary | ICD-10-CM

## 2013-06-27 NOTE — Telephone Encounter (Signed)
Lab orders placed.  

## 2013-06-27 NOTE — Telephone Encounter (Signed)
Pt is coming in for labs tomorrow what labs and dx?  

## 2013-06-27 NOTE — Telephone Encounter (Signed)
Pt is coming in for labs tomorrow what labs and dX?  

## 2013-06-28 ENCOUNTER — Other Ambulatory Visit (INDEPENDENT_AMBULATORY_CARE_PROVIDER_SITE_OTHER): Payer: 59

## 2013-06-28 DIAGNOSIS — E7800 Pure hypercholesterolemia, unspecified: Secondary | ICD-10-CM

## 2013-06-28 DIAGNOSIS — R7989 Other specified abnormal findings of blood chemistry: Secondary | ICD-10-CM

## 2013-06-28 DIAGNOSIS — E119 Type 2 diabetes mellitus without complications: Secondary | ICD-10-CM

## 2013-06-28 DIAGNOSIS — R945 Abnormal results of liver function studies: Secondary | ICD-10-CM

## 2013-06-28 LAB — HEPATIC FUNCTION PANEL
ALT: 54 U/L — AB (ref 0–35)
AST: 44 U/L — ABNORMAL HIGH (ref 0–37)
Albumin: 4.2 g/dL (ref 3.5–5.2)
Alkaline Phosphatase: 64 U/L (ref 39–117)
BILIRUBIN DIRECT: 0.1 mg/dL (ref 0.0–0.3)
BILIRUBIN TOTAL: 0.7 mg/dL (ref 0.3–1.2)
Total Protein: 7.2 g/dL (ref 6.0–8.3)

## 2013-06-28 LAB — BASIC METABOLIC PANEL
BUN: 11 mg/dL (ref 6–23)
CO2: 26 meq/L (ref 19–32)
Calcium: 9.7 mg/dL (ref 8.4–10.5)
Chloride: 102 mEq/L (ref 96–112)
Creatinine, Ser: 0.9 mg/dL (ref 0.4–1.2)
GFR: 86.21 mL/min (ref >60.00)
GLUCOSE: 193 mg/dL — AB (ref 70–99)
Potassium: 4.3 mEq/L (ref 3.5–5.1)
Sodium: 136 mEq/L (ref 135–145)

## 2013-06-28 LAB — HEMOGLOBIN A1C: Hgb A1c MFr Bld: 11.9 % — ABNORMAL HIGH (ref 4.6–6.5)

## 2013-06-28 LAB — LIPID PANEL
CHOL/HDL RATIO: 4
Cholesterol: 165 mg/dL (ref 0–200)
HDL: 44.1 mg/dL (ref >39.00)
LDL Cholesterol: 98 mg/dL (ref 0–99)
Triglycerides: 116 mg/dL (ref 0.0–149.0)
VLDL: 23.2 mg/dL (ref 0.0–40.0)

## 2013-06-29 ENCOUNTER — Encounter: Payer: Self-pay | Admitting: Internal Medicine

## 2013-06-30 ENCOUNTER — Other Ambulatory Visit: Payer: Self-pay | Admitting: *Deleted

## 2013-06-30 ENCOUNTER — Encounter: Payer: Self-pay | Admitting: *Deleted

## 2013-06-30 MED ORDER — METRONIDAZOLE 500 MG PO TABS: 2000.0000 mg | ORAL_TABLET | Freq: Once | ORAL | Status: DC

## 2013-07-07 LAB — TEST CODE CHANGE

## 2013-07-07 LAB — VAGINITIS/VAGINOSIS, DNA PROBE
Candida Species: NEGATIVE
GARDNERELLA VAGINALIS: NEGATIVE
Trichomonas vaginosis: POSITIVE — AB

## 2013-07-13 ENCOUNTER — Encounter: Payer: Self-pay | Admitting: Internal Medicine

## 2013-07-21 ENCOUNTER — Ambulatory Visit (INDEPENDENT_AMBULATORY_CARE_PROVIDER_SITE_OTHER): Payer: 59 | Admitting: Internal Medicine

## 2013-07-21 ENCOUNTER — Encounter: Payer: Self-pay | Admitting: Internal Medicine

## 2013-07-21 ENCOUNTER — Other Ambulatory Visit (HOSPITAL_COMMUNITY)
Admission: RE | Admit: 2013-07-21 | Discharge: 2013-07-21 | Disposition: A | Discharge status: Home / Self Care | Payer: 59 | Source: Ambulatory Visit | Attending: Internal Medicine | Admitting: Internal Medicine

## 2013-07-21 VITALS — BP 112/68 | HR 69 | Temp 98.3°F | Ht 63.5 in | Wt 266.2 lb

## 2013-07-21 DIAGNOSIS — R7989 Other specified abnormal findings of blood chemistry: Secondary | ICD-10-CM

## 2013-07-21 DIAGNOSIS — Z1151 Encounter for screening for human papillomavirus (HPV): Secondary | ICD-10-CM | POA: Insufficient documentation

## 2013-07-21 DIAGNOSIS — E119 Type 2 diabetes mellitus without complications: Secondary | ICD-10-CM

## 2013-07-21 DIAGNOSIS — R945 Abnormal results of liver function studies: Secondary | ICD-10-CM

## 2013-07-21 DIAGNOSIS — Z1239 Encounter for other screening for malignant neoplasm of breast: Secondary | ICD-10-CM

## 2013-07-21 DIAGNOSIS — E7800 Pure hypercholesterolemia, unspecified: Secondary | ICD-10-CM

## 2013-07-21 DIAGNOSIS — R112 Nausea with vomiting, unspecified: Secondary | ICD-10-CM

## 2013-07-21 DIAGNOSIS — Z01419 Encounter for gynecological examination (general) (routine) without abnormal findings: Secondary | ICD-10-CM | POA: Insufficient documentation

## 2013-07-21 DIAGNOSIS — Z124 Encounter for screening for malignant neoplasm of cervix: Secondary | ICD-10-CM

## 2013-07-21 DIAGNOSIS — I10 Essential (primary) hypertension: Secondary | ICD-10-CM

## 2013-07-21 MED ORDER — GLIPIZIDE ER 5 MG PO TB24: 5.0000 mg | ORAL_TABLET | Freq: Every day | ORAL | Status: DC

## 2013-07-21 NOTE — Progress Notes (Signed)
Subjective:    Patient ID: Denise Mathis, female    DOB: 03/27/1961, 53 y.o.   MRN: [id number]  HPI 53 year old female with past history of diabetes, hypertension and hypercholesterolemia who comes in today to follow up on these issues as well as for a complete physical exam.   Diagnosed with diabetes at age 53.  Has been to [facility].  She is eating.   Doing better watching what she eats.  Sugars have improved.  See attached list for details.  AM sugars averaging 140-160 and PM sugars averaging 150s.   No abdominal pain and cramping.   Breathing stable.  No cardiac symptoms  with increased activity or exertion.  Saw ortho regarding her shoulder.  MRI performed.  See report for details.  Rotator cuff problems.  Went to therapy.        Past Medical History  Diagnosis Date  . History of chicken pox   . Diabetes mellitus without complication   . Ulcer   . Hypertension   . Hypercholesterolemia   . Hx: UTI (urinary tract infection)     Outpatient Encounter Prescriptions as of 07/21/2013  Medication Sig  . glipiZIDE (GLUCOTROL XL) 5 MG 24 hr tablet Take 1 tablet (5 mg total) by mouth daily.  Marland Kitchen. glucose blood test strip Use as instructed  . lisinopril (PRINIVIL,ZESTRIL) 10 MG tablet Take 1 tablet (10 mg total) by mouth daily.  . metFORMIN (GLUCOPHAGE) 1000 MG tablet Take 1 tablet (1,000 mg total) by mouth 2 (two) times daily with a meal.  . nystatin cream (MYCOSTATIN) Apply topically 2 (two) times daily.  . ONE TOUCH LANCETS MISC Use as instructed  . sitaGLIPtin (JANUVIA) 100 MG tablet Take 1 tablet (100 mg total) by mouth daily.  . [DISCONTINUED] glipiZIDE (GLUCOTROL XL) 5 MG 24 hr tablet Take 1 tablet (5 mg total) by mouth daily.  . [DISCONTINUED] metroNIDAZOLE (FLAGYL) 500 MG tablet Take 4 tablets (2,000 mg total) by mouth once.    Review of Systems Patient denies any headache, lightheadedness or dizziness.  No sinus or allergy symptoms.  No chest pain, tightness or palpitations.  No  increased shortness of breath, cough or congestion.  No nausea or vomiting now.  No acid reflux.  History of ulcer disease.   No abdominal pain or cramping.  No BRBPR or melana.  Taking the metformin.  Some loose stool with this.  States is getting better.  Desires not to change medication.   No urine change.  No vaginal problems.  Recently treated for trich.  We discussed this today.  Sugars as outlined.        Objective:   Physical Exam  Filed Vitals:   07/21/13 1555  BP: 112/68  Pulse: 69  Temp: 98.3 F (1336.678 C)   53 year old female in no acute distress.   HEENT:  Nares- clear.  Oropharynx - without lesions. NECK:  Supple.  Nontender.  No audible bruit.  HEART:  Appears to be regular. LUNGS:  No crackles or wheezing audible.  Respirations even and unlabored.  RADIAL PULSE:  Equal bilaterally.    BREASTS:  No nipple discharge or nipple retraction present.  Could not appreciate any distinct nodules or axillary adenopathy.  ABDOMEN:  Soft, nontender.  Bowel sounds present and normal.  No audible abdominal bruit.  GU:  Normal external genitalia.  Vaginal vault without lesions.  Cervix identified.  Pap performed. Could not appreciate any adnexal masses or tenderness.   RECTAL:  Heme negative.  EXTREMITIES:  No increased edema present.  DP pulses palpable and equal bilaterally.      FEET:  No lesions.       Assessment & Plan:  HEALTH MAINTENANCE.  Physical today.   Pap performed.  Schedule mammogram.  Saw GI recently.    I spent 25 minutes with the patient and more than 50% of the time was spent in consultation regarding the above.

## 2013-07-21 NOTE — Progress Notes (Signed)
Pre visit review using our clinic review tool, if applicable. No additional management support is needed unless otherwise documented below in the visit note. 

## 2013-07-21 NOTE — Assessment & Plan Note (Addendum)
Liver function elevated but stable.  Ultrasound revealed fatty liver and some hepatomegaly.  Referred to GI for further evaluation and treatment.  Did not keep appt.  Just saw GI.  Obtain records.

## 2013-07-21 NOTE — Assessment & Plan Note (Addendum)
Has been doing better watching what she eats. Still has some adjustments to make (per her report).   Not exercising regularly.  Plans to start.   Had her eyes checked in 4/14.  Blood sugars attached.  Improved.  Follow metabolic panel and a1c.  Hold on making adjustments in her medication.  Follow closely.

## 2013-07-24 ENCOUNTER — Encounter: Payer: Self-pay | Admitting: Internal Medicine

## 2013-07-24 NOTE — Assessment & Plan Note (Signed)
Blood pressure under good control.  Same medication regimen.  Follow metabolic panel.   

## 2013-07-24 NOTE — Assessment & Plan Note (Signed)
Low cholesterol diet.  Check cholesterol panel.

## 2013-07-24 NOTE — Assessment & Plan Note (Signed)
Has had intermittent episodes of nausea and vomiting previously.  See previous note for details.  Not reported as an issue today.  Just saw GI.  Obtain records.  Abdominal us as outlined.  Follow.

## 2013-07-25 ENCOUNTER — Encounter: Payer: Self-pay | Admitting: Emergency Medicine

## 2013-07-26 ENCOUNTER — Encounter: Payer: Self-pay | Admitting: Internal Medicine

## 2013-07-28 NOTE — Telephone Encounter (Signed)
Unread mychart message mailed to patient 

## 2013-09-04 LAB — HM DIABETES EYE EXAM

## 2013-09-05 ENCOUNTER — Other Ambulatory Visit: Payer: Self-pay | Admitting: Gastroenterology

## 2013-09-05 LAB — CLOSTRIDIUM DIFFICILE(ARMC)

## 2013-09-07 LAB — STOOL CULTURE

## 2013-09-08 ENCOUNTER — Ambulatory Visit: Payer: Self-pay | Admitting: Internal Medicine

## 2013-09-08 LAB — HM MAMMOGRAPHY: HM MAMMO: NEGATIVE

## 2013-09-12 ENCOUNTER — Encounter: Payer: Self-pay | Admitting: Internal Medicine

## 2013-09-29 ENCOUNTER — Other Ambulatory Visit (INDEPENDENT_AMBULATORY_CARE_PROVIDER_SITE_OTHER): Payer: 59

## 2013-09-29 DIAGNOSIS — R945 Abnormal results of liver function studies: Secondary | ICD-10-CM

## 2013-09-29 DIAGNOSIS — E119 Type 2 diabetes mellitus without complications: Secondary | ICD-10-CM

## 2013-09-29 DIAGNOSIS — R7989 Other specified abnormal findings of blood chemistry: Secondary | ICD-10-CM

## 2013-09-29 DIAGNOSIS — E7800 Pure hypercholesterolemia, unspecified: Secondary | ICD-10-CM

## 2013-09-29 LAB — LIPID PANEL
CHOL/HDL RATIO: 4
Cholesterol: 168 mg/dL (ref 0–200)
HDL: 43.2 mg/dL (ref >39.00)
LDL Cholesterol: 96 mg/dL (ref 0–99)
NonHDL: 124.8
TRIGLYCERIDES: 144 mg/dL (ref 0.0–149.0)
VLDL: 28.8 mg/dL (ref 0.0–40.0)

## 2013-09-29 LAB — COMPREHENSIVE METABOLIC PANEL
ALBUMIN: 4.2 g/dL (ref 3.5–5.2)
ALT: 49 U/L — ABNORMAL HIGH (ref 0–35)
AST: 40 U/L — AB (ref 0–37)
Alkaline Phosphatase: 52 U/L (ref 39–117)
BUN: 12 mg/dL (ref 6–23)
CALCIUM: 9.8 mg/dL (ref 8.4–10.5)
CHLORIDE: 104 meq/L (ref 96–112)
CO2: 27 mEq/L (ref 19–32)
Creatinine, Ser: 0.9 mg/dL (ref 0.4–1.2)
GFR: 80.85 mL/min (ref >60.00)
Glucose, Bld: 159 mg/dL — ABNORMAL HIGH (ref 70–99)
POTASSIUM: 4.5 meq/L (ref 3.5–5.1)
Sodium: 139 mEq/L (ref 135–145)
Total Bilirubin: 0.6 mg/dL (ref 0.2–1.2)
Total Protein: 7.7 g/dL (ref 6.0–8.3)

## 2013-09-29 LAB — HEMOGLOBIN A1C: Hgb A1c MFr Bld: 9.7 % — ABNORMAL HIGH (ref 4.6–6.5)

## 2013-09-30 ENCOUNTER — Encounter: Payer: Self-pay | Admitting: Internal Medicine

## 2013-10-02 ENCOUNTER — Ambulatory Visit (INDEPENDENT_AMBULATORY_CARE_PROVIDER_SITE_OTHER): Payer: 59 | Admitting: Internal Medicine

## 2013-10-02 ENCOUNTER — Encounter: Payer: Self-pay | Admitting: Internal Medicine

## 2013-10-02 VITALS — BP 120/78 | HR 88 | Temp 98.4°F | Ht 63.5 in | Wt 266.2 lb

## 2013-10-02 DIAGNOSIS — E7800 Pure hypercholesterolemia, unspecified: Secondary | ICD-10-CM

## 2013-10-02 DIAGNOSIS — R945 Abnormal results of liver function studies: Secondary | ICD-10-CM

## 2013-10-02 DIAGNOSIS — I10 Essential (primary) hypertension: Secondary | ICD-10-CM

## 2013-10-02 DIAGNOSIS — E119 Type 2 diabetes mellitus without complications: Secondary | ICD-10-CM

## 2013-10-02 DIAGNOSIS — R7989 Other specified abnormal findings of blood chemistry: Secondary | ICD-10-CM

## 2013-10-02 NOTE — Telephone Encounter (Signed)
Unread mychart message mailed to patient 

## 2013-10-02 NOTE — Progress Notes (Signed)
Pre visit review using our clinic review tool, if applicable. No additional management support is needed unless otherwise documented below in the visit note. 

## 2013-10-06 ENCOUNTER — Other Ambulatory Visit: Payer: Self-pay | Admitting: Internal Medicine

## 2013-10-06 ENCOUNTER — Encounter: Payer: Self-pay | Admitting: Internal Medicine

## 2013-10-06 NOTE — Assessment & Plan Note (Signed)
Has been doing better watching what she eats.  Blood sugars as outlined.  Doing better.   Follow metabolic panel and a1c.  Hold on making adjustments in her medication.  Follow closely.  Keep up to date with eye checks.

## 2013-10-06 NOTE — Assessment & Plan Note (Signed)
Blood pressure under good control.  Same medication regimen.  Follow metabolic panel.   

## 2013-10-06 NOTE — Assessment & Plan Note (Signed)
Low cholesterol diet.  Cholesterol just checked and revealed total cholesterol 168, triglycerides 144, HDL 43 and LDL 96.

## 2013-10-06 NOTE — Progress Notes (Signed)
  Subjective:    Patient ID: Denise SchaumannJannie D Mathis, female    DOB: 02/28/1961, 53 y.o.   MRN: [id number]  HPI 53 year old female with past history of diabetes, hypertension and hypercholesterolemia who comes in today for a scheduled follow up.  Diagnosed with diabetes at age 53.  Has been to [facility].  Doing better watching what she eats.  Sugars have improved. States am sugars averaging 160-170 and pm sugars averaging 180.  No abdominal pain or cramping.   Breathing stable.  No cardiac symptoms  with increased activity or exertion.  Planning for colonoscopy next week.        Past Medical History  Diagnosis Date  . History of chicken pox   . Diabetes mellitus without complication   . Ulcer   . Hypertension   . Hypercholesterolemia   . Hx: UTI (urinary tract infection)     Outpatient Encounter Prescriptions as of 10/02/2013  Medication Sig  . glipiZIDE (GLUCOTROL XL) 5 MG 24 hr tablet Take 1 tablet (5 mg total) by mouth daily.  Marland Kitchen. glucose blood test strip Use as instructed  . lisinopril (PRINIVIL,ZESTRIL) 10 MG tablet Take 1 tablet (10 mg total) by mouth daily.  . metFORMIN (GLUCOPHAGE) 1000 MG tablet Take 1 tablet (1,000 mg total) by mouth 2 (two) times daily with a meal.  . nystatin cream (MYCOSTATIN) Apply topically 2 (two) times daily.  . ONE TOUCH LANCETS MISC Use as instructed  . sitaGLIPtin (JANUVIA) 100 MG tablet Take 1 tablet (100 mg total) by mouth daily.    Review of Systems Patient denies any headache, lightheadedness or dizziness.  No sinus or allergy symptoms.  No chest pain, tightness or palpitations.  No increased shortness of breath, cough or congestion.  No nausea or vomiting.  No acid reflux.  History of ulcer disease.   No abdominal pain or cramping.  No BRBPR or melana.  Taking the metformin.   No urine change.  No vaginal problems.  Watching her diet.  We discussed diet and exercise.  Sugars better.  Feels better.         Objective:   Physical Exam  Filed Vitals:   10/02/13 1538  BP: 120/78  Pulse: 88  Temp: 98.4 F (2936.329 C)   53 year old female in no acute distress.   HEENT:  Nares- clear.  Oropharynx - without lesions. NECK:  Supple.  Nontender.  No audible bruit.  HEART:  Appears to be regular. LUNGS:  No crackles or wheezing audible.  Respirations even and unlabored.  RADIAL PULSE:  Equal bilaterally.  ABDOMEN:  Soft, nontender.  Bowel sounds present and normal.  No audible abdominal bruit.    EXTREMITIES:  No increased edema present.  DP pulses palpable and equal bilaterally.      FEET:  No lesions.       Assessment & Plan:  HEALTH MAINTENANCE.  Physical 07/21/13.   Pap 07/21/13 negative with negative HPV.  Mammogram 09/08/13.  Saw GI recently.  Planning for colonoscopy next week.

## 2013-10-06 NOTE — Assessment & Plan Note (Signed)
Liver function elevated but stable.  Ultrasound revealed fatty liver and some hepatomegaly.  Referred to GI for further evaluation and treatment.  Just saw GI.  Follow.

## 2013-10-27 IMAGING — CR DG CHEST 1V
1 series · 1 of 1 positions shown · non-contrast
Comparison: none

REASON FOR EXAM: groin abcess
COMMENTS:   LMP: Post Hysterectomy

[ap]
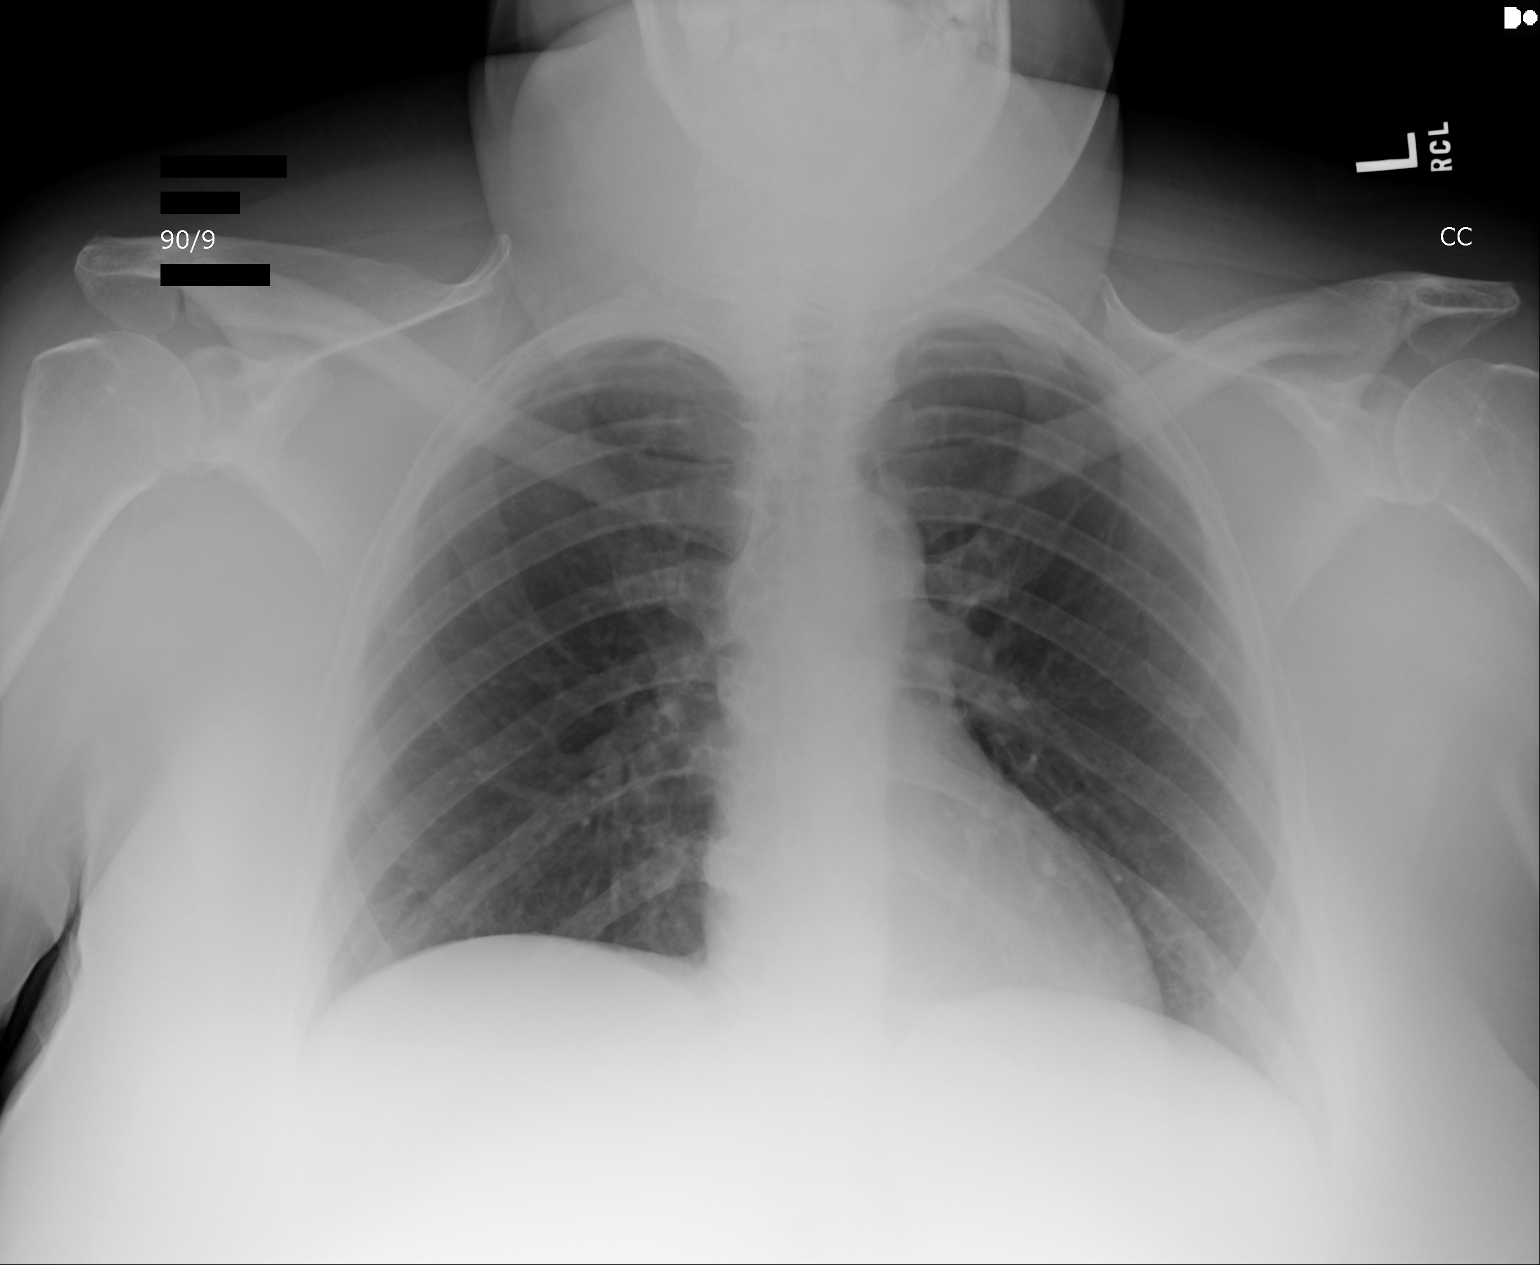

[1 of 1 positions shown; findings below may reference images not displayed]

PROCEDURE:     DXR - DXR CHEST 1 VIEWAP OR PA  - April 08, 2012 [DATE]

RESULT:     Comparison is made to the study 28 January, 2012.

There is shallow inspiratory effort. The lungs are clear. The heart and
pulmonary vessels are normal. The bony and mediastinal structures are
unremarkable. There is no effusion. There is no pneumothorax or evidence of
congestive failure.
IMPRESSION: No acute cardiopulmonary disease.

[REDACTED]

## 2013-11-05 ENCOUNTER — Other Ambulatory Visit: Payer: Self-pay | Admitting: Internal Medicine

## 2013-11-29 ENCOUNTER — Encounter: Payer: Self-pay | Admitting: *Deleted

## 2013-11-30 ENCOUNTER — Other Ambulatory Visit: Payer: Self-pay | Admitting: Internal Medicine

## 2013-11-30 ENCOUNTER — Encounter: Payer: Self-pay | Admitting: *Deleted

## 2013-11-30 DIAGNOSIS — Z0000 Encounter for general adult medical examination without abnormal findings: Secondary | ICD-10-CM

## 2013-11-30 NOTE — Progress Notes (Signed)
Order placed for cotinine

## 2013-11-30 NOTE — Telephone Encounter (Signed)
Pt called in and told her about the email and what it said and she stated she is going to come in tomorrow morning for her lab and was wondering if there was any way she could go ahead and fax the paperwork she already has in and making the nicotine part pending..it is due before Monday. Please advise.

## 2013-12-01 ENCOUNTER — Other Ambulatory Visit (INDEPENDENT_AMBULATORY_CARE_PROVIDER_SITE_OTHER): Payer: 59

## 2013-12-01 DIAGNOSIS — Z0000 Encounter for general adult medical examination without abnormal findings: Secondary | ICD-10-CM

## 2013-12-06 LAB — SPECIMEN STATUS REPORT

## 2013-12-07 ENCOUNTER — Ambulatory Visit: Payer: 59 | Admitting: Internal Medicine

## 2013-12-10 LAB — NICOTINE/COTININE METABOLITES
COTININE: NOT DETECTED ng/mL
Nicotine: NOT DETECTED ng/mL

## 2013-12-12 ENCOUNTER — Encounter: Payer: Self-pay | Admitting: *Deleted

## 2013-12-16 ENCOUNTER — Other Ambulatory Visit: Payer: Self-pay | Admitting: Internal Medicine

## 2013-12-25 ENCOUNTER — Other Ambulatory Visit: Payer: Self-pay | Admitting: Internal Medicine

## 2014-01-23 ENCOUNTER — Ambulatory Visit (INDEPENDENT_AMBULATORY_CARE_PROVIDER_SITE_OTHER): Payer: 59 | Admitting: Internal Medicine

## 2014-01-23 ENCOUNTER — Encounter: Payer: Self-pay | Admitting: Internal Medicine

## 2014-01-23 VITALS — BP 120/70 | HR 80 | Temp 98.2°F | Ht 63.5 in | Wt 264.5 lb

## 2014-01-23 DIAGNOSIS — I10 Essential (primary) hypertension: Secondary | ICD-10-CM

## 2014-01-23 DIAGNOSIS — R7989 Other specified abnormal findings of blood chemistry: Secondary | ICD-10-CM

## 2014-01-23 DIAGNOSIS — E7800 Pure hypercholesterolemia, unspecified: Secondary | ICD-10-CM

## 2014-01-23 DIAGNOSIS — E119 Type 2 diabetes mellitus without complications: Secondary | ICD-10-CM

## 2014-01-23 DIAGNOSIS — R945 Abnormal results of liver function studies: Secondary | ICD-10-CM

## 2014-01-23 NOTE — Progress Notes (Signed)
Pre visit review using our clinic review tool, if applicable. No additional management support is needed unless otherwise documented below in the visit note. 

## 2014-01-28 ENCOUNTER — Encounter: Payer: Self-pay | Admitting: Internal Medicine

## 2014-01-28 NOTE — Assessment & Plan Note (Signed)
Blood pressure under good control.  Same medication regimen.  Follow metabolic panel.    BP Readings from Last 3 Encounters:  01/23/14 120/70  10/02/13 120/78  07/21/13 112/68

## 2014-01-28 NOTE — Assessment & Plan Note (Signed)
Has been doing better watching what she eats.  Has really adjusted her diet and is monitoring her carbs.  Exercising.   Doing better.   Follow metabolic panel and a1c.  Hold on making adjustments in her medication.  Follow closely.  Keep up to date with eye checks.

## 2014-01-28 NOTE — Assessment & Plan Note (Signed)
Low cholesterol diet.  Cholesterol last checked and revealed total cholesterol 168, triglycerides 144, HDL 43 and LDL 96.  Follow.    Lab Results  Component Value Date   CHOL 168 09/29/2013   HDL 43.20 09/29/2013   LDLCALC 96 09/29/2013   TRIG 144.0 09/29/2013   CHOLHDL 4 09/29/2013

## 2014-01-28 NOTE — Assessment & Plan Note (Signed)
Liver function elevated but stable.  Ultrasound revealed fatty liver and some hepatomegaly.  Referred to GI for further evaluation and treatment.  Recheck liver panel.

## 2014-01-28 NOTE — Progress Notes (Signed)
Subjective:    Patient ID: Denise Mathis, female    DOB: 01/07/1961, 53 y.o.   MRN: [id number]  HPI 53 year old female with past history of diabetes, hypertension and hypercholesterolemia who comes in today for a scheduled follow up.  Diagnosed with diabetes at age 53.  Has been to [facility].  Doing better watching what she eats.  She has adjusted her diet.  Monitoring carbs.  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  They are exercising together.  Going to the gym.  States sugars are doing much better.  Brought in no recorded sugar readings.  No abdominal pain or cramping.   Breathing stable.  No cardiac symptoms  with increased activity or exertion.  Overall feels better.         Past Medical History  Diagnosis Date  . History of chicken pox   . Diabetes mellitus without complication   . Ulcer   . Hypertension   . Hypercholesterolemia   . Hx: UTI (urinary tract infection)     Outpatient Encounter Prescriptions as of 01/23/2014  Medication Sig  . GLIPIZIDE XL 5 MG 24 hr tablet Take 1 tablet by mouth  daily  . glucose blood test strip Use as instructed  . JANUVIA 100 MG tablet Take 1 tablet by mouth  daily  . lisinopril (PRINIVIL,ZESTRIL) 10 MG tablet Take 1 tablet by mouth  daily  . metFORMIN (GLUCOPHAGE) 500 MG tablet Take 1 tablet by mouth 2  times daily with a meal.  . nystatin cream (MYCOSTATIN) Apply topically 2 (two) times daily.  . ONE TOUCH LANCETS MISC Use as instructed  . [DISCONTINUED] metFORMIN (GLUCOPHAGE) 1000 MG tablet Take 1 tablet (1,000 mg total) by mouth 2 (two) times daily with a meal.    Review of Systems Patient denies any headache, lightheadedness or dizziness.  No sinus or allergy symptoms.  No chest pain, tightness or palpitations.  No increased shortness of breath, cough or congestion.  No nausea or vomiting.  No acid reflux.  History of ulcer disease.   No abdominal pain or cramping.  No BRBPR or melana.  Taking the metformin.  Only on 500mg  bid.  Did  not get the 1000mg  filled.   No urine change.  No vaginal problems.  Watching her diet and exercising.   We discussed diet and exercise.  Sugars better.  Feels better.         Objective:   Physical Exam  Filed Vitals:   01/23/14 0934  BP: 120/70  Pulse: 80  Temp: 98.2 F (1136.188 C)   53 year old female in no acute distress.   HEENT:  Nares- clear.  Oropharynx - without lesions. NECK:  Supple.  Nontender.  No audible bruit.  HEART:  Appears to be regular. LUNGS:  No crackles or wheezing audible.  Respirations even and unlabored.  RADIAL PULSE:  Equal bilaterally.  ABDOMEN:  Soft, nontender.  Bowel sounds present and normal.  No audible abdominal bruit.    EXTREMITIES:  No increased edema present.  DP pulses palpable and equal bilaterally.      FEET:  No lesions.       Assessment & Plan:  HEALTH MAINTENANCE.  Physical 07/21/13.   Pap 07/21/13 negative with negative HPV.  Mammogram 09/08/13.  Saw GI recently.   Problem List Items Addressed This Visit   Abnormal liver function tests     Liver function elevated but stable.  Ultrasound revealed fatty liver and some hepatomegaly.  Referred to GI for further evaluation and treatment.  Recheck liver panel.      Diabetes     Has been doing better watching what she eats.  Has really adjusted her diet and is monitoring her carbs.  Exercising.   Doing better.   Follow metabolic panel and a1c.  Hold on making adjustments in her medication.  Follow closely.  Keep up to date with eye checks.        Essential hypertension, benign - Primary      Blood pressure under good control.  Same medication regimen.  Follow metabolic panel.    BP Readings from Last 3 Encounters:  01/23/14 120/70  10/02/13 120/78  07/21/13 112/68      Hypercholesterolemia      Low cholesterol diet.  Cholesterol last checked and revealed total cholesterol 168, triglycerides 144, HDL 43 and LDL 96.  Follow.    Lab Results  Component Value Date   CHOL 168 09/29/2013    HDL 43.20 09/29/2013   LDLCALC 96 09/29/2013   TRIG 144.0 09/29/2013   CHOLHDL 4 09/29/2013

## 2014-03-13 ENCOUNTER — Encounter: Payer: Self-pay | Admitting: Internal Medicine

## 2014-03-13 LAB — BASIC METABOLIC PANEL
BUN: 13 mg/dL (ref 4–21)
Creatinine: 1.1 mg/dL (ref 0.5–1.1)
Glucose: 193 mg/dL
Potassium: 5 mmol/L (ref 3.4–5.3)
SODIUM: 140 mmol/L (ref 137–147)

## 2014-03-13 LAB — LIPID PANEL
Cholesterol: 198 mg/dL (ref 0–200)
HDL: 50 mg/dL (ref 35–70)
LDL Cholesterol: 124 mg/dL
Triglycerides: 120 mg/dL (ref 40–160)

## 2014-03-13 LAB — HEPATIC FUNCTION PANEL
ALK PHOS: 67 U/L (ref 25–125)
ALT: 25 U/L (ref 7–35)
AST: 17 U/L (ref 13–35)
Bilirubin, Direct: 0.09 mg/dL (ref 0.01–0.4)
Bilirubin, Total: 0.3 mg/dL

## 2014-03-13 LAB — HEMOGLOBIN A1C: Hgb A1c MFr Bld: 9.1 % — AB (ref 4.0–6.0)

## 2014-03-26 ENCOUNTER — Ambulatory Visit: Payer: 59 | Admitting: Internal Medicine

## 2014-04-03 ENCOUNTER — Ambulatory Visit (INDEPENDENT_AMBULATORY_CARE_PROVIDER_SITE_OTHER): Payer: 59 | Admitting: Internal Medicine

## 2014-04-03 ENCOUNTER — Encounter: Payer: Self-pay | Admitting: Internal Medicine

## 2014-04-03 DIAGNOSIS — R945 Abnormal results of liver function studies: Secondary | ICD-10-CM

## 2014-04-03 DIAGNOSIS — Z658 Other specified problems related to psychosocial circumstances: Secondary | ICD-10-CM

## 2014-04-03 DIAGNOSIS — E7800 Pure hypercholesterolemia, unspecified: Secondary | ICD-10-CM

## 2014-04-03 DIAGNOSIS — I10 Essential (primary) hypertension: Secondary | ICD-10-CM

## 2014-04-03 DIAGNOSIS — R7989 Other specified abnormal findings of blood chemistry: Secondary | ICD-10-CM

## 2014-04-03 DIAGNOSIS — F439 Reaction to severe stress, unspecified: Secondary | ICD-10-CM

## 2014-04-03 DIAGNOSIS — E119 Type 2 diabetes mellitus without complications: Secondary | ICD-10-CM

## 2014-04-03 LAB — HM DIABETES FOOT EXAM

## 2014-04-03 MED ORDER — METFORMIN HCL 500 MG PO TABS: ORAL_TABLET | ORAL | Status: DC

## 2014-04-03 NOTE — Progress Notes (Signed)
Subjective:    Patient ID: Denise Mathis, female    DOB: 07/11/1960, 53 y.o.   MRN: [id number]  HPI 53 year old female with past history of diabetes, hypertension and hypercholesterolemia who comes in today for a scheduled follow up.  Diagnosed with diabetes at age 53.  Has been to [facility].  States she has not been watching what she eats (as well).  Plans to get back in a routine of watching better what she eats.   She has adjusted her diet.  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  Is walking/exercising 2x/week.  Plans to do more.   Brought in no recorded sugar readings.  She states her am sugars are averaging 165 and pm sugars 140-160s in the pm and 180s before bed.   A1c just checked - 9.1.   She had her eyes checked this summer at [facility].  No abdominal pain or cramping.   Breathing stable.  No cardiac symptoms  with increased activity or exertion.  Overall feels she is doing well.  Did notice part of her toenail came off - left great toe.  No redness.         Past Medical History  Diagnosis Date  . History of chicken pox   . Diabetes mellitus without complication   . Ulcer   . Hypertension   . Hypercholesterolemia   . Hx: UTI (urinary tract infection)     Outpatient Encounter Prescriptions as of 04/03/2014  Medication Sig  . GLIPIZIDE XL 5 MG 24 hr tablet Take 1 tablet by mouth  daily  . glucose blood test strip Use as instructed  . JANUVIA 100 MG tablet Take 1 tablet by mouth  daily  . lisinopril (PRINIVIL,ZESTRIL) 10 MG tablet Take 1 tablet by mouth  daily  . metFORMIN (GLUCOPHAGE) 500 MG tablet Take 1 tablet by mouth 2  times daily with a meal.  . nystatin cream (MYCOSTATIN) Apply topically 2 (two) times daily.  . ONE TOUCH LANCETS MISC Use as instructed    Review of Systems Patient denies any headache, lightheadedness or dizziness.  No sinus or allergy symptoms.  No chest pain, tightness or palpitations. No increased shortness of breath, cough or congestion.   No nausea or vomiting.  No acid reflux.  History of ulcer disease.   No abdominal pain or cramping.  No BRBPR or melana.  Taking the metformin.  Only on 500mg  bid.  We discussed increasing the dose.  Was concerned over possible GI intolerance.  She feels she may be able to tolerate if she splits the dose.   No urine change.  No vaginal problems.  We discussed diet and exercise.  See above.  Some increased stress recently.  Father had MI.  Doing better.           Objective:   Physical Exam  Filed Vitals:   04/03/14 0906  BP: 122/80  Pulse: 88  Temp: 98.4 F (5436.139 C)   53 year old female in no acute distress.   HEENT:  Nares- clear.  Oropharynx - without lesions. NECK:  Supple.  Nontender.  No audible bruit.  HEART:  Appears to be regular. LUNGS:  No crackles or wheezing audible.  Respirations even and unlabored.  RADIAL PULSE:  Equal bilaterally.  ABDOMEN:  Soft, nontender.  Bowel sounds present and normal.  No audible abdominal bruit.    EXTREMITIES:  No increased edema present.  DP pulses palpable and equal bilaterally.  FEET:  No lesions.       Assessment & Plan:  1. Severe obesity (BMI >= 40) Discussed diet and exercise.  See above.    2. Essential hypertension, benign Blood pressure doing well on lisinopril.  Same medication regimen.  Cr just checked 1.1.  Follow.   3. Type 2 diabetes mellitus without complication A1c and sugars as outlined.  Elevated.  See above.  On metformin, januvia and glipizide.  Will increase metformin.  She will take 500mg  at breakfast, 500mg  at lunch and 1000mg  at supper (for better tolerance per pt).  Follow sugars.  Diet and exercise.  Get her back in soon to reassess.    4. Hypercholesterolemia Low cholesterol diet and exercise.  LDL just checked 124.  Follow.    5. Abnormal liver function tests 03/13/14 liver panel wnl.  Follow.  Saw GI.    6. Stress Increased stress with her father's recent MI.  Feels she is coping well.  Follow.     HEALTH MAINTENANCE.  Physical 07/21/13.   Pap 07/21/13 negative with negative HPV.  Mammogram 09/08/13.  Saw GI recently.  Recommended colonoscopy.     I spent 25 minutes with the patient and more than 50% of the time was spent in consultation regarding the above.

## 2014-04-03 NOTE — Progress Notes (Signed)
Pre visit review using our clinic review tool, if applicable. No additional management support is needed unless otherwise documented below in the visit note. 

## 2014-04-08 ENCOUNTER — Encounter: Payer: Self-pay | Admitting: Internal Medicine

## 2014-04-08 DIAGNOSIS — F439 Reaction to severe stress, unspecified: Secondary | ICD-10-CM | POA: Insufficient documentation

## 2014-04-12 ENCOUNTER — Encounter: Payer: Self-pay | Admitting: Internal Medicine

## 2014-04-17 ENCOUNTER — Other Ambulatory Visit: Payer: Self-pay | Admitting: Internal Medicine

## 2014-06-07 ENCOUNTER — Ambulatory Visit: Payer: 59 | Admitting: Internal Medicine

## 2014-06-14 ENCOUNTER — Other Ambulatory Visit: Payer: Self-pay | Admitting: Internal Medicine

## 2014-07-13 IMAGING — US ABDOMEN ULTRASOUND LIMITED
1 series · 14 of 25 positions shown · non-contrast
Comparison: none

REASON FOR EXAM: Abn LFT
COMMENTS:

[Series 1: abdomen ultrasound limited · 0.30mm/px · 14 of 48 slices shown]
[im 1/48]
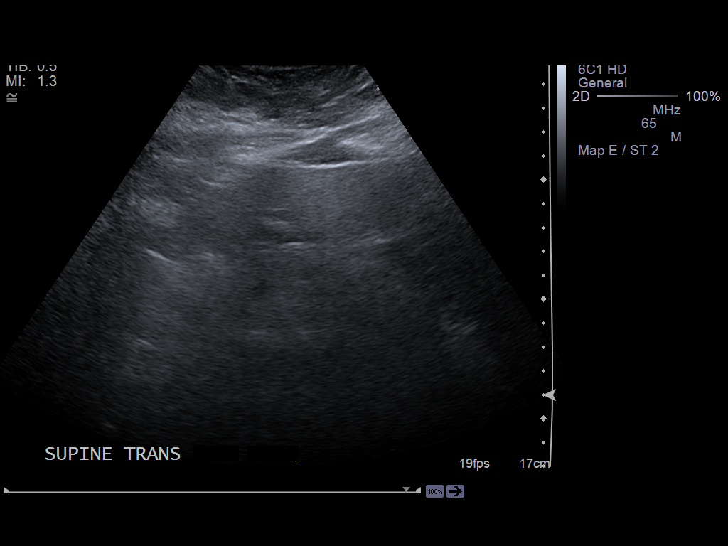
[im 4/48]
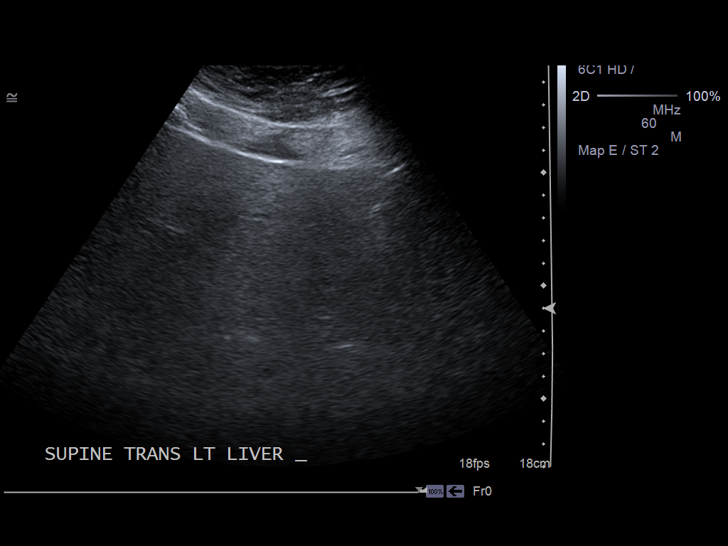
[im 8/48]
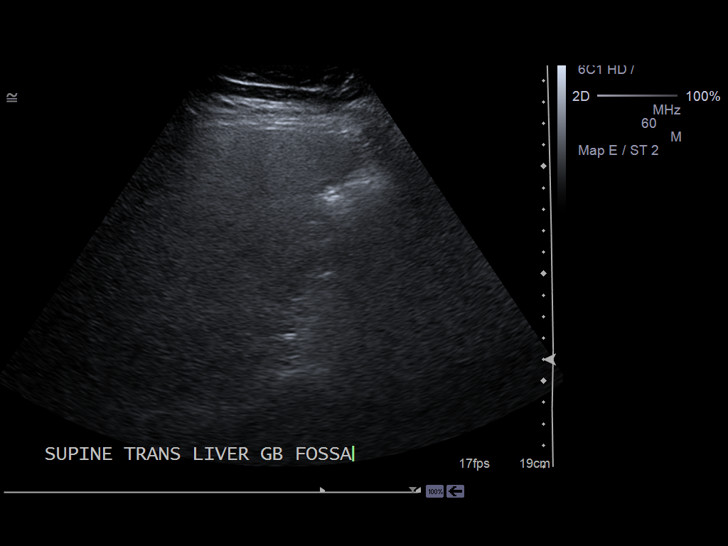
[im 12/48]
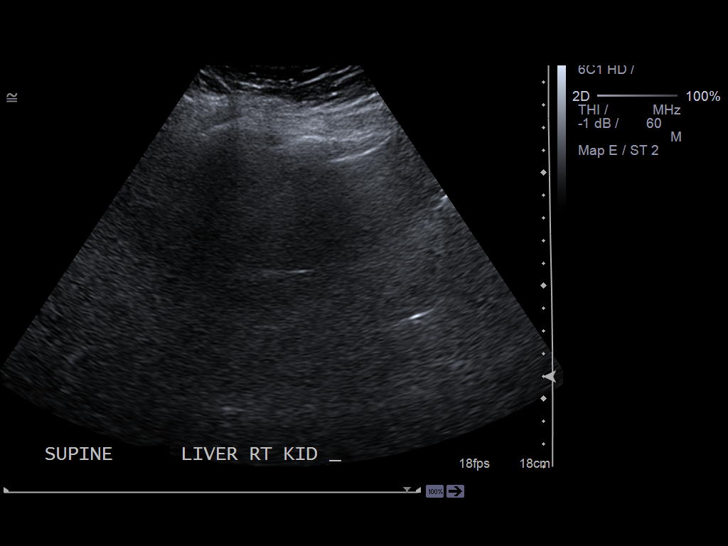
[im 16/48]
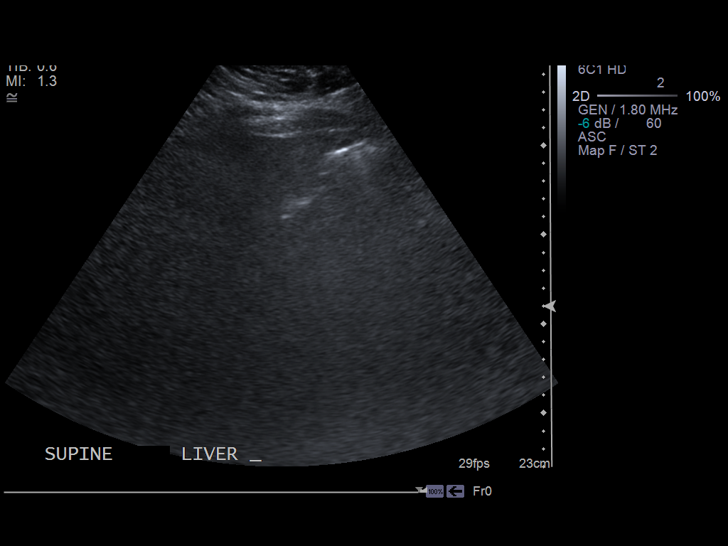
[im 18/48]
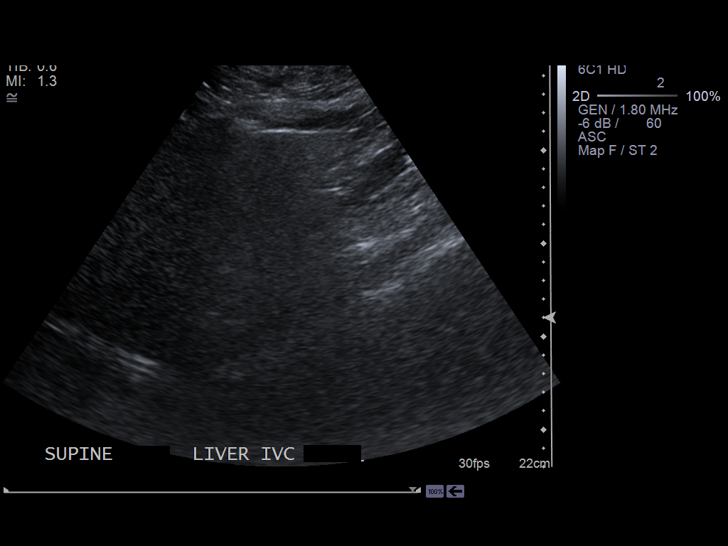
[im 22/48]
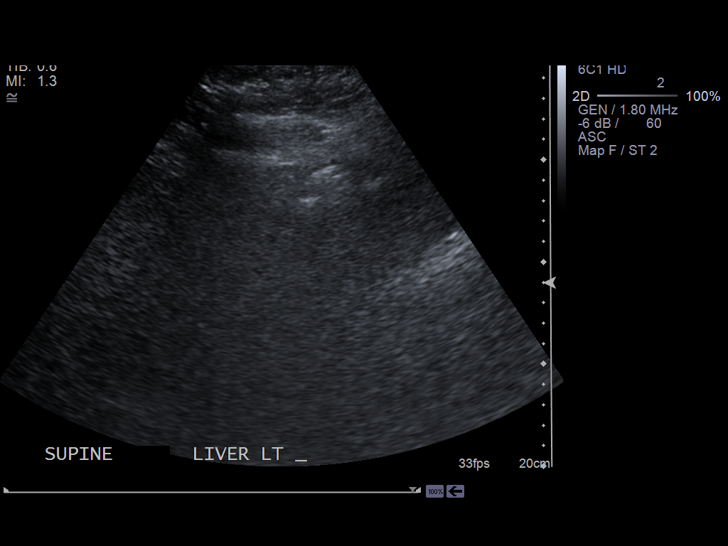
[im 26/48]
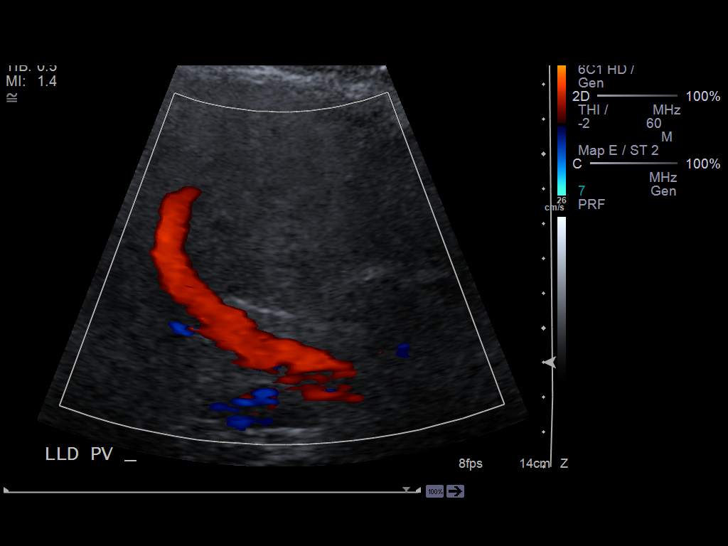
[im 30/48]
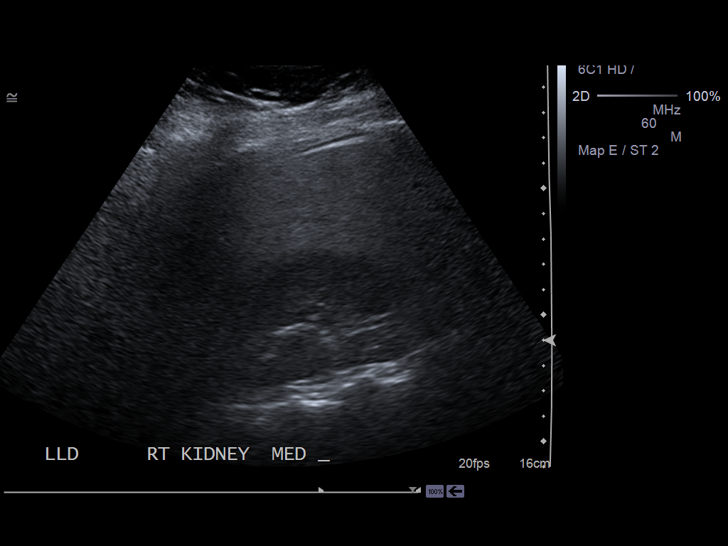
[im 32/48]
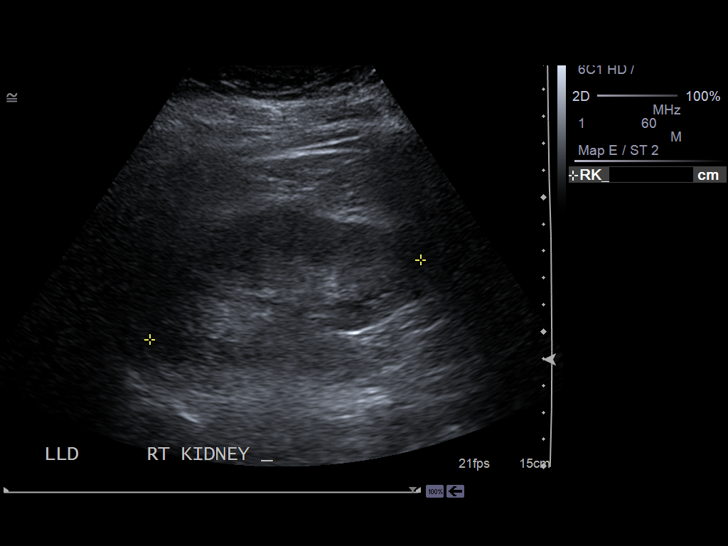
[im 36/48]
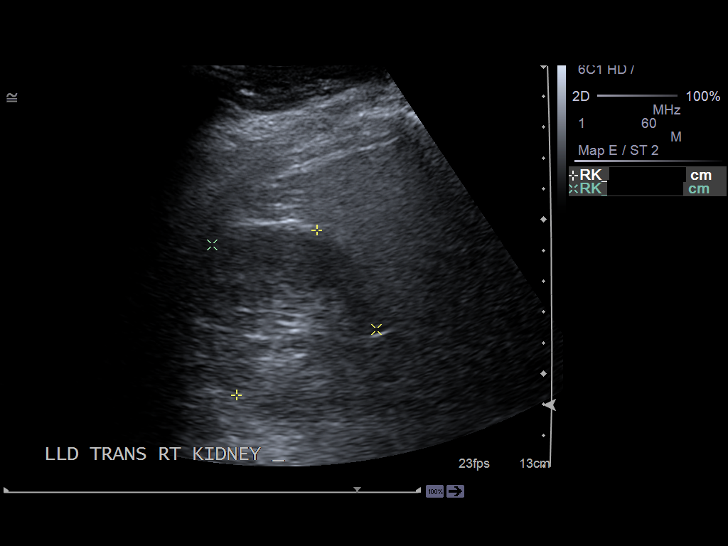
[im 40/48]
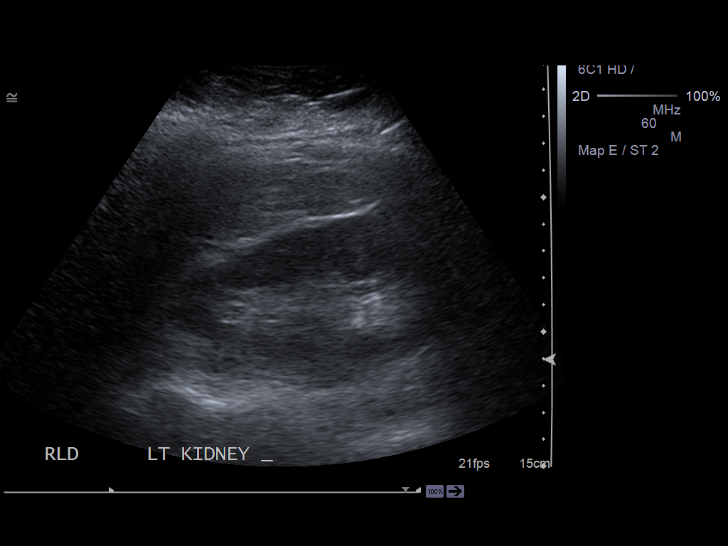
[im 44/48]
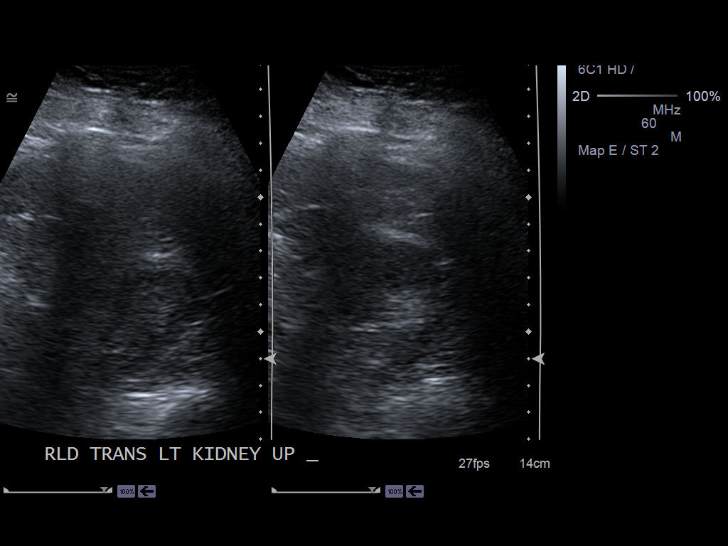
[im 48/48]
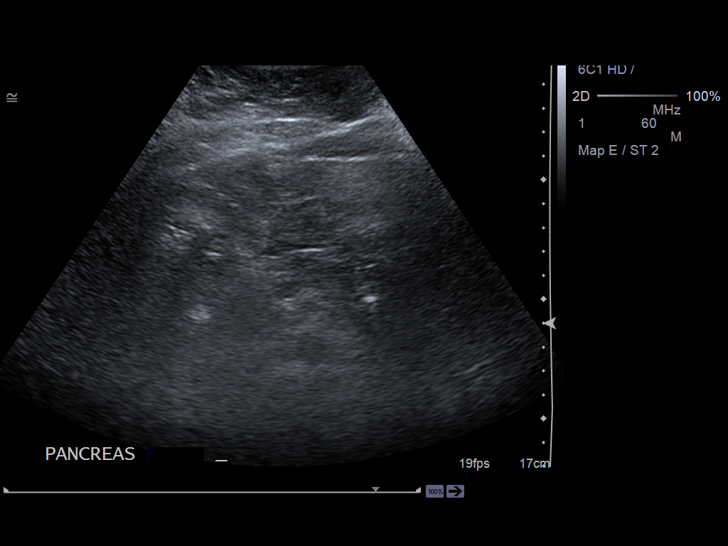

[14 of 25 positions shown; findings below may reference images not displayed]

PROCEDURE:     CHE - CHE ABDOMEN UPPER GENERAL  - December 23, 2012 [DATE]

RESULT:     Abdominal ultrasound is performed. There is limited
visualization of the pancreas because of overlying bowel gas call the
pancreas to be predominantly obscured. There is a history of
cholecystectomy. The liver is extremely echogenic and difficult to
penetrate. The liver length measures 17.66 cm which is borderline enlarged.
No ascites is evident. The aorta is normal in caliber. The proximal inferior
vena cava is not visualized. The portal venous flow is normal. Common bile
duct diameter is 3.9 mm. No ascites is evident. The right kidney measures
10.49 x 5.99 x 5.95 cm. The spleen measures up to 7.65 cm and shows a normal
echotexture. The left kidney measures 10.64 x 4.38 x 5.85 cm. Neither kidney
shows evidence of stones or solid or cystic mass. There is no obstruction.
IMPRESSION: 1. Extremely dense echotexture of the liver consistent with hepatic
steatosis. Nonvisualization of the proximal inferior vena cava and pancreas.
Status post cholecystectomy. Borderline hepatomegaly.

[REDACTED]

## 2014-07-27 NOTE — Op Note (Signed)
PATIENT NAME:  Denise Mathis, Denise Mathis MR#:  [id number] DATE OF BIRTH:  03-31-1961  DATE OF PROCEDURE:  04/08/2012  PREOPERATIVE DIAGNOSIS: Right groin abscess.   POSTOPERATIVE DIAGNOSIS: Right groin abscess.  PROCEDURE: Incision and drainage of right groin abscess.  SURGEON: Kamani Lewter A. Rona Tomson, M.Mathis.   ANESTHESIA: MAC local.   ESTIMATED BLOOD LOSS: 25 mL.   SPECIMEN: Swab for culture and sensitivity.   INDICATION FOR SURGERY: Denise Mathis is a pleasant 54 year old diabetic female who presents with a one-week history of worsening right groin wound. I have assessed her in the office and she was found to have a fluctuant mass in the right groin and thus brought her to the operating room for incision and drainage.   DETAILS OF PROCEDURE: Denise Mathis was brought to the operating room suite. She was induced with IV anesthesia and pain medication was administered. Her right groin was then prepped and draped in standard surgical fashion. A timeout was then performed correctly identifying the patient name, operative site and procedure to be performed. Incision was made at the anterior aspect of the fluctuant mass. Purulence was obtained and some was sent for culture. A Kelly hemostat was used to open the abscess cavity and break up any loculations. A counterincision was made inferiorly/posteriorly. A Penrose drain was then placed through the wound and was sutured at both the entry sites and Betadine-soaked gauze was used to pack the wound for hemostasis. A dressing was then placed over the wound. The patient was then       awakened and brought to the postanesthesia care unit. There were no immediate complications. Needle, sponge and instrument counts were correct at the end of the procedure.  ____________________________ Si Raiderhristopher A. Raford Brissett, MD cal:sb Mathis: 04/08/2012 14:21:01 ET T: 04/08/2012 15:24:45 ET JOB#: [id number]  cc: Cristal Deerhristopher A. Narya Beavin, MD, <Dictator> Jarvis NewcomerHRISTOPHER A Will Schier  MD ELECTRONICALLY SIGNED 04/12/2012 18:34

## 2014-08-03 ENCOUNTER — Encounter: Payer: Self-pay | Admitting: Internal Medicine

## 2014-08-03 ENCOUNTER — Ambulatory Visit (INDEPENDENT_AMBULATORY_CARE_PROVIDER_SITE_OTHER): Payer: 59 | Admitting: Internal Medicine

## 2014-08-03 VITALS — BP 124/80 | HR 79 | Temp 98.2°F | Ht 64.0 in | Wt 265.5 lb

## 2014-08-03 DIAGNOSIS — Z658 Other specified problems related to psychosocial circumstances: Secondary | ICD-10-CM

## 2014-08-03 DIAGNOSIS — R7989 Other specified abnormal findings of blood chemistry: Secondary | ICD-10-CM

## 2014-08-03 DIAGNOSIS — Z0000 Encounter for general adult medical examination without abnormal findings: Secondary | ICD-10-CM | POA: Diagnosis not present

## 2014-08-03 DIAGNOSIS — Z1239 Encounter for other screening for malignant neoplasm of breast: Secondary | ICD-10-CM

## 2014-08-03 DIAGNOSIS — I10 Essential (primary) hypertension: Secondary | ICD-10-CM

## 2014-08-03 DIAGNOSIS — E7800 Pure hypercholesterolemia, unspecified: Secondary | ICD-10-CM

## 2014-08-03 DIAGNOSIS — R945 Abnormal results of liver function studies: Secondary | ICD-10-CM

## 2014-08-03 DIAGNOSIS — R112 Nausea with vomiting, unspecified: Secondary | ICD-10-CM

## 2014-08-03 DIAGNOSIS — F439 Reaction to severe stress, unspecified: Secondary | ICD-10-CM

## 2014-08-03 DIAGNOSIS — E119 Type 2 diabetes mellitus without complications: Secondary | ICD-10-CM

## 2014-08-03 LAB — BASIC METABOLIC PANEL
BUN: 15 mg/dL (ref 6–23)
CHLORIDE: 102 meq/L (ref 96–112)
CO2: 26 mEq/L (ref 19–32)
CREATININE: 0.93 mg/dL (ref 0.40–1.20)
Calcium: 9.9 mg/dL (ref 8.4–10.5)
GFR: 81.58 mL/min (ref >60.00)
Glucose, Bld: 150 mg/dL — ABNORMAL HIGH (ref 70–99)
Potassium: 4.6 mEq/L (ref 3.5–5.1)
Sodium: 136 mEq/L (ref 135–145)

## 2014-08-03 LAB — CBC WITH DIFFERENTIAL/PLATELET
BASOS ABS: 0 10*3/uL (ref 0.0–0.1)
Basophils Relative: 0.7 % (ref 0.0–3.0)
EOS ABS: 0.1 10*3/uL (ref 0.0–0.7)
EOS PCT: 2.5 % (ref 0.0–5.0)
HCT: 40.4 % (ref 36.0–46.0)
HEMOGLOBIN: 13.3 g/dL (ref 12.0–15.0)
Lymphocytes Relative: 41.3 % (ref 12.0–46.0)
Lymphs Abs: 2.3 10*3/uL (ref 0.7–4.0)
MCHC: 33 g/dL (ref 30.0–36.0)
MCV: 83.6 fl (ref 78.0–100.0)
Monocytes Absolute: 0.3 10*3/uL (ref 0.1–1.0)
Monocytes Relative: 6.2 % (ref 3.0–12.0)
NEUTROS ABS: 2.7 10*3/uL (ref 1.4–7.7)
NEUTROS PCT: 49.3 % (ref 43.0–77.0)
Platelets: 234 10*3/uL (ref 150.0–400.0)
RBC: 4.83 Mil/uL (ref 3.87–5.11)
RDW: 13.2 % (ref 11.5–15.5)
WBC: 5.6 10*3/uL (ref 4.0–10.5)

## 2014-08-03 LAB — MICROALBUMIN / CREATININE URINE RATIO
CREATININE, U: 189.6 mg/dL
MICROALB/CREAT RATIO: 0.4 mg/g (ref 0.0–30.0)
Microalb, Ur: 0.7 mg/dL (ref 0.0–1.9)

## 2014-08-03 LAB — HEPATIC FUNCTION PANEL
ALBUMIN: 4.2 g/dL (ref 3.5–5.2)
ALK PHOS: 60 U/L (ref 39–117)
ALT: 34 U/L (ref 0–35)
AST: 26 U/L (ref 0–37)
BILIRUBIN DIRECT: 0.1 mg/dL (ref 0.0–0.3)
Total Bilirubin: 0.3 mg/dL (ref 0.2–1.2)
Total Protein: 6.9 g/dL (ref 6.0–8.3)

## 2014-08-03 LAB — LIPID PANEL
CHOL/HDL RATIO: 4
Cholesterol: 172 mg/dL (ref 0–200)
HDL: 47.3 mg/dL (ref >39.00)
LDL Cholesterol: 100 mg/dL — ABNORMAL HIGH (ref 0–99)
NonHDL: 124.7
TRIGLYCERIDES: 126 mg/dL (ref 0.0–149.0)
VLDL: 25.2 mg/dL (ref 0.0–40.0)

## 2014-08-03 LAB — TSH: TSH: 1.72 u[IU]/mL (ref 0.35–4.50)

## 2014-08-03 LAB — HEMOGLOBIN A1C: Hgb A1c MFr Bld: 9.5 % — ABNORMAL HIGH (ref 4.6–6.5)

## 2014-08-03 MED ORDER — NYSTATIN 100000 UNIT/GM EX CREA: TOPICAL_CREAM | Freq: Two times a day (BID) | CUTANEOUS | Status: AC

## 2014-08-03 NOTE — Assessment & Plan Note (Addendum)
Brought in no recorded sugar readings.  Discussed continued diet and exercise.  She is exercising regularly.  Has cut out soft drinks.  Cut back on bread.  Check met b and a1c.  Had eye exam in 09/2013.  Agrees to two hour refresher at [employer].

## 2014-08-03 NOTE — Progress Notes (Signed)
Pre visit review using our clinic review tool, if applicable. No additional management support is needed unless otherwise documented below in the visit note. 

## 2014-08-03 NOTE — Progress Notes (Signed)
Patient ID: Denise Mathis, female   DOB: 06/12/1960, 54 y.o.   MRN: [id number]   Subjective:    Patient ID: Denise Mathis, female    DOB: [birth year], 54 y.o.   MRN: [id number]  HPI  Patient here for her physical exam.  Brought in no recorded sugar readings.  Is exercising.  Involved in a program at work.  She is trying to stay away from sodas and bread.  States sugars have been higher lateley.  States am sugars averaging 160 and pm sugars 180 after eating.  She has questions about diet.  Discussed a two hour refresher at [employer].  No cardiac symptoms with increased activity or exertion.  Breathing stable.  No abdominal pain.  No bowel change.  No nausea or vomiting.     Past Medical History  Diagnosis Date  . History of chicken pox   . Diabetes mellitus without complication   . Ulcer   . Hypertension   . Hypercholesterolemia   . Hx: UTI (urinary tract infection)     Current Outpatient Prescriptions on File Prior to Visit  Medication Sig Dispense Refill  . GLIPIZIDE XL 5 MG 24 hr tablet Take 1 tablet by mouth  daily 90 tablet 1  . glucose blood test strip Use as instructed 100 each 12  . JANUVIA 100 MG tablet Take 1 tablet by mouth  daily 90 tablet 2  . lisinopril (PRINIVIL,ZESTRIL) 10 MG tablet Take 1 tablet by mouth  daily 90 tablet 1  . metFORMIN (GLUCOPHAGE) 500 MG tablet One at breakfast, one at lunch and two with supper 360 tablet 1  . ONE TOUCH LANCETS MISC Use as instructed 200 each 1   No current facility-administered medications on file prior to visit.     Review of Systems  Constitutional: Negative for appetite change and unexpected weight change (discussed concern regarding not losing weight.  discussed diet and exercise.  ).  HENT: Positive for sore throat. Negative for congestion and sinus pressure.   Eyes: Negative for pain and visual disturbance.  Respiratory: Negative for cough, chest tightness and shortness of breath.   Cardiovascular: Negative for  chest pain and palpitations.  Gastrointestinal: Negative for nausea, vomiting, abdominal pain and diarrhea.  Genitourinary: Negative for dysuria and difficulty urinating.  Musculoskeletal: Negative for back pain and joint swelling.  Skin: Negative for color change and rash.  Neurological: Negative for dizziness, light-headedness and headaches.  Hematological: Negative for adenopathy. Does not bruise/bleed easily.  Psychiatric/Behavioral: Negative for dysphoric mood and agitation.       Objective:     Blood pressure recheck:  114/72  Physical Exam  Constitutional: She is oriented to person, place, and time. She appears well-developed and well-nourished.  HENT:  Nose: Nose normal.  Mouth/Throat: Oropharynx is clear and moist.  Eyes: Right eye exhibits no discharge. Left eye exhibits no discharge. No scleral icterus.  Neck: Neck supple. No thyromegaly present.  Cardiovascular: Normal rate and regular rhythm.   Pulmonary/Chest: Breath sounds normal. No accessory muscle usage. No tachypnea. No respiratory distress. She has no decreased breath sounds. She has no wheezes. She has no rhonchi. Right breast exhibits no inverted nipple, no mass, no nipple discharge and no tenderness (no axillary adenopathy). Left breast exhibits no inverted nipple, no mass, no nipple discharge and no tenderness (no axilarry adenopathy).  Abdominal: Soft. Bowel sounds are normal. There is no tenderness.  Musculoskeletal: She exhibits no edema or tenderness.  Lymphadenopathy:    She has no cervical  adenopathy.  Neurological: She is alert and oriented to person, place, and time.  Skin: Skin is warm. No rash noted.  Psychiatric: She has a normal mood and affect. Her behavior is normal.    BP 124/80 mmHg  Pulse 79  Temp(Src) 98.2 F (36.8 C) (Oral)  Ht _0  (1.626 m)  Wt 265 lb 8 oz (120.43 kg)  BMI 45.55 kg/m2  SpO2 99% Wt Readings from Last 3 Encounters:  08/03/14 265 lb 8 oz (120.43 kg)  04/03/14 265  lb 4 oz (120.317 kg)  01/23/14 264 lb 8 oz (119.976 kg)     Lab Results  Component Value Date   WBC 5.6 08/03/2014   HGB 13.3 08/03/2014   HCT 40.4 08/03/2014   PLT 234.0 08/03/2014   GLUCOSE 150* 08/03/2014   CHOL 172 08/03/2014   TRIG 126.0 08/03/2014   HDL 47.30 08/03/2014   LDLCALC 100* 08/03/2014   ALT 34 08/03/2014   AST 26 08/03/2014   NA 136 08/03/2014   K 4.6 08/03/2014   CL 102 08/03/2014   CREATININE 0.93 08/03/2014   BUN 15 08/03/2014   CO2 26 08/03/2014   TSH 1.72 08/03/2014   HGBA1C 9.5* 08/03/2014   MICROALBUR 0.7 08/03/2014       Assessment & Plan:   Problem List Items Addressed This Visit    Abnormal liver function tests    Ultrasound revealed fatty liver and hepatomegaly.  Referred to GI for further evaluation.  Recheck liver panel.        Relevant Orders   CBC with Differential/Platelet   Hepatic function panel   Diabetes - Primary    Brought in no recorded sugar readings.  Discussed continued diet and exercise.  She is exercising regularly.  Has cut out soft drinks.  Cut back on bread.  Check met b and a1c.  Had eye exam in 09/2013.  Agrees to two hour refresher at [employer].        Relevant Orders   TSH   Hemoglobin A1c   Microalbumin / creatinine urine ratio (Completed)   Essential hypertension, benign    Blood pressure doing well.  Same medication regimen.  Follow pressures.  Follow metabolic panel.       Relevant Orders   Basic metabolic panel   Health care maintenance    Physical 08/03/14.  PAP 07/21/13 negative with negative HPV.  Mammogram 09/08/13 - Birads I.  Schedule f/u mammogram.  Discussed colonoscopy.  She will call to schedule.  Has already been referred and discussed with them regarding colonoscopy.       Hypercholesterolemia    Low cholesterol diet and exercise.  Follow lipid panel.        Relevant Orders   Lipid panel   Nausea with vomiting    Has not been an issue lately.  Follow.        Severe obesity (BMI >= 40)      Discussed diet and exercise.  Refer to [employer] for two hour refresher.        Stress    Appears to be handling things well.  Follow.         Other Visit Diagnoses    Breast cancer screening        Relevant Orders    MM DIGITAL SCREENING BILATERAL      I spent 25 minutes with the patient and more than 50% of the time was spent in consultation regarding the above.     Einar Pheasant, MD

## 2014-08-04 ENCOUNTER — Encounter: Payer: Self-pay | Admitting: Internal Medicine

## 2014-08-05 ENCOUNTER — Encounter: Payer: Self-pay | Admitting: Internal Medicine

## 2014-08-05 DIAGNOSIS — Z0000 Encounter for general adult medical examination without abnormal findings: Secondary | ICD-10-CM | POA: Insufficient documentation

## 2014-08-05 NOTE — Assessment & Plan Note (Signed)
Physical 08/03/14.  PAP 07/21/13 negative with negative HPV.  Mammogram 09/08/13 - Birads I.  Schedule f/u mammogram.  Discussed colonoscopy.  She will call to schedule.  Has already been referred and discussed with them regarding colonoscopy.

## 2014-08-05 NOTE — Assessment & Plan Note (Signed)
Has not been an issue lately.  Follow.

## 2014-08-05 NOTE — Assessment & Plan Note (Signed)
Low cholesterol diet and exercise.  Follow lipid panel.   

## 2014-08-05 NOTE — Assessment & Plan Note (Signed)
Discussed diet and exercise.  Refer to [employer] for two hour refresher.

## 2014-08-05 NOTE — Assessment & Plan Note (Signed)
Appears to be handling things well.  Follow.  

## 2014-08-05 NOTE — Assessment & Plan Note (Signed)
Blood pressure doing well.  Same medication regimen.  Follow pressures.  Follow metabolic panel.   

## 2014-08-05 NOTE — Assessment & Plan Note (Signed)
Ultrasound revealed fatty liver and hepatomegaly.  Referred to GI for further evaluation.  Recheck liver panel.

## 2014-08-06 ENCOUNTER — Encounter: Payer: Self-pay | Admitting: Internal Medicine

## 2014-08-07 NOTE — Telephone Encounter (Signed)
Unread mychart message mailed to patient 

## 2014-08-29 ENCOUNTER — Encounter: Payer: Self-pay | Admitting: Internal Medicine

## 2014-08-29 NOTE — Telephone Encounter (Signed)
Patient called again to see if Dr. Lorin PicketScott received her message.

## 2014-08-30 ENCOUNTER — Other Ambulatory Visit: Payer: Self-pay | Admitting: Internal Medicine

## 2014-09-13 ENCOUNTER — Other Ambulatory Visit: Payer: Self-pay | Admitting: Internal Medicine

## 2014-09-19 ENCOUNTER — Ambulatory Visit
Admission: RE | Admit: 2014-09-19 | Discharge: 2014-09-19 | Disposition: A | Discharge status: Home / Self Care | Payer: 59 | Source: Ambulatory Visit | Attending: Internal Medicine | Admitting: Internal Medicine

## 2014-09-19 DIAGNOSIS — Z1231 Encounter for screening mammogram for malignant neoplasm of breast: Secondary | ICD-10-CM | POA: Diagnosis not present

## 2014-09-19 DIAGNOSIS — Z1239 Encounter for other screening for malignant neoplasm of breast: Secondary | ICD-10-CM

## 2014-09-21 ENCOUNTER — Ambulatory Visit: Payer: 59 | Admitting: *Deleted

## 2014-10-01 LAB — HM DIABETES EYE EXAM

## 2014-10-02 ENCOUNTER — Encounter: Payer: Self-pay | Admitting: *Deleted

## 2014-12-04 ENCOUNTER — Ambulatory Visit: Payer: 59 | Admitting: Internal Medicine

## 2015-01-28 ENCOUNTER — Ambulatory Visit (INDEPENDENT_AMBULATORY_CARE_PROVIDER_SITE_OTHER): Payer: 59 | Admitting: Internal Medicine

## 2015-01-28 ENCOUNTER — Encounter: Payer: Self-pay | Admitting: Internal Medicine

## 2015-01-28 VITALS — BP 124/80 | HR 67 | Temp 98.0°F | Resp 18 | Ht 64.0 in | Wt 260.0 lb

## 2015-01-28 DIAGNOSIS — E119 Type 2 diabetes mellitus without complications: Secondary | ICD-10-CM

## 2015-01-28 DIAGNOSIS — Z658 Other specified problems related to psychosocial circumstances: Secondary | ICD-10-CM

## 2015-01-28 DIAGNOSIS — E7800 Pure hypercholesterolemia, unspecified: Secondary | ICD-10-CM | POA: Diagnosis not present

## 2015-01-28 DIAGNOSIS — I10 Essential (primary) hypertension: Secondary | ICD-10-CM | POA: Diagnosis not present

## 2015-01-28 DIAGNOSIS — R7989 Other specified abnormal findings of blood chemistry: Secondary | ICD-10-CM

## 2015-01-28 DIAGNOSIS — F439 Reaction to severe stress, unspecified: Secondary | ICD-10-CM

## 2015-01-28 DIAGNOSIS — R945 Abnormal results of liver function studies: Secondary | ICD-10-CM

## 2015-01-28 DIAGNOSIS — Z1211 Encounter for screening for malignant neoplasm of colon: Secondary | ICD-10-CM

## 2015-01-28 MED ORDER — LISINOPRIL 10 MG PO TABS
ORAL_TABLET | ORAL | Status: DC
Start: 2015-01-28 — End: 2015-02-13

## 2015-01-28 MED ORDER — METFORMIN HCL 500 MG PO TABS: ORAL_TABLET | ORAL | Status: DC

## 2015-01-28 MED ORDER — GLIPIZIDE ER 5 MG PO TB24: ORAL_TABLET | ORAL | Status: DC

## 2015-01-28 MED ORDER — SITAGLIPTIN PHOSPHATE 100 MG PO TABS: ORAL_TABLET | ORAL | Status: DC

## 2015-01-28 NOTE — Progress Notes (Signed)
Patient ID: Denise Mathis, female   DOB: 1960/08/21, 54 y.o.   MRN: [id number]   Subjective:    Patient ID: Denise Mathis, female    DOB: [birth year], 54 y.o.   MRN: [id number]  HPI  Patient with past history of diabetes, hypertension and hypercholesterolemia who comes in today to follow up on these issues.  She had not been watching her diet.  We discussed this today.  She has just recently started back.  Also has just started walking.  States sugar has started to improve.  Am sugars 190-200.  She has forgotten her meds on some occasions.  Discussed importance of taking her medications regularly.  No chest pain or tightness.  No sob.  No acid reflux.  No abdominal pain or cramping.  No bowel changes.     Past Medical History  Diagnosis Date  . History of chicken pox   . Diabetes mellitus without complication ([place])   . Ulcer   . Hypertension   . Hypercholesterolemia   . Hx: UTI (urinary tract infection)    Past Surgical History  Procedure Laterality Date  . Cholecystectomy  2005  . Breast surgery  2012    biopsy  . Leg surgery Right 1/14    mass/lump removal & tube placed in leg  . Abdominal hysterectomy  2006    partial, for abnormal bleeding  . Breast biopsy Left 2012    stereo, negative   Family History  Problem Relation Age of Onset  . Prostate cancer Father   . Hyperlipidemia Father   . Heart disease Father   . Stroke Father   . Hypertension Father   . Diabetes Father   . Hyperlipidemia Mother   . Hypertension Mother   . Diabetes Mother   . Diabetes Sister   . Diabetes Brother     borderline  . Breast cancer Maternal Aunt 60  . Diabetes Maternal Grandmother   . Diabetes Maternal Grandfather   . Heart disease Paternal Grandmother   . Diabetes Paternal Grandmother   . Diabetes Paternal Grandfather    Social History   Social History  . Marital Status: Married    Spouse Name: N/A  . Number of Children: N/A  . Years of Education: N/A   Social History  Main Topics  . Smoking status: Never Smoker   . Smokeless tobacco: Never Used  . Alcohol Use: No  . Drug Use: No  . Sexual Activity: Not Asked   Other Topics Concern  . None   Social History Narrative    Outpatient Encounter Prescriptions as of 01/28/2015  Medication Sig  . glipiZIDE (GLIPIZIDE XL) 5 MG 24 hr tablet Take 1 tablet by mouth  daily  . glucose blood test strip Use as instructed  . lisinopril (PRINIVIL,ZESTRIL) 10 MG tablet Take 1 tablet by mouth  daily  . metFORMIN (GLUCOPHAGE) 500 MG tablet Take one tablet by mouth at breakfast, one tablet at  lunch and two tablets with  supper  . nystatin cream (MYCOSTATIN) Apply topically 2 (two) times daily.  . ONE TOUCH LANCETS MISC Use as instructed  . sitaGLIPtin (JANUVIA) 100 MG tablet Take 1 tablet by mouth  daily  . [DISCONTINUED] GLIPIZIDE XL 5 MG 24 hr tablet Take 1 tablet by mouth  daily  . [DISCONTINUED] JANUVIA 100 MG tablet Take 1 tablet by mouth  daily  . [DISCONTINUED] lisinopril (PRINIVIL,ZESTRIL) 10 MG tablet Take 1 tablet by mouth  daily  . [DISCONTINUED] metFORMIN (GLUCOPHAGE) 500 MG  tablet Take one tablet by mouth at breakfast, one tablet at  lunch and two tablets with  supper   No facility-administered encounter medications on file as of 01/28/2015.    Review of Systems  Constitutional: Negative for appetite change and unexpected weight change.  HENT: Negative for congestion and sinus pressure.   Eyes: Negative for discharge and visual disturbance.  Respiratory: Negative for cough, chest tightness and shortness of breath.   Cardiovascular: Negative for chest pain, palpitations and leg swelling.  Gastrointestinal: Negative for nausea, vomiting, abdominal pain and diarrhea.  Genitourinary: Negative for dysuria and difficulty urinating.  Musculoskeletal: Negative for back pain and joint swelling.  Skin: Negative for color change and rash.  Neurological: Negative for dizziness, light-headedness and headaches.    Psychiatric/Behavioral: Negative for dysphoric mood and agitation.       Objective:    Physical Exam  Constitutional: She appears well-developed and well-nourished. No distress.  HENT:  Nose: Nose normal.  Mouth/Throat: Oropharynx is clear and moist.  Eyes: Conjunctivae are normal. Right eye exhibits no discharge. Left eye exhibits no discharge.  Neck: Neck supple. No thyromegaly present.  Cardiovascular: Normal rate and regular rhythm.   Pulmonary/Chest: Breath sounds normal. No respiratory distress. She has no wheezes.  Abdominal: Soft. Bowel sounds are normal. There is no tenderness.  Musculoskeletal: She exhibits no edema or tenderness.  Lymphadenopathy:    She has no cervical adenopathy.  Skin: No rash noted. No erythema.  Psychiatric: She has a normal mood and affect. Her behavior is normal.    BP 124/80 mmHg  Pulse 67  Temp(Src) 98 F (36.7 C) (Oral)  Resp 18  Ht 5' 4"  (1.626 m)  Wt 260 lb (117.935 kg)  BMI 44.61 kg/m2  SpO2 99% Wt Readings from Last 3 Encounters:  01/28/15 260 lb (117.935 kg)  08/03/14 265 lb 8 oz (120.43 kg)  04/03/14 265 lb 4 oz (120.317 kg)     Lab Results  Component Value Date   WBC 5.6 08/03/2014   HGB 13.3 08/03/2014   HCT 40.4 08/03/2014   PLT 234.0 08/03/2014   GLUCOSE 294* 01/28/2015   CHOL 184 01/28/2015   TRIG 99 01/28/2015   HDL 49 01/28/2015   LDLCALC 115* 01/28/2015   ALT 29 01/28/2015   AST 22 01/28/2015   NA 137 01/28/2015   K 4.7 01/28/2015   CL 96* 01/28/2015   CREATININE 0.93 01/28/2015   BUN 10 01/28/2015   CO2 22 01/28/2015   TSH 1.72 08/03/2014   HGBA1C 10.9* 01/28/2015   MICROALBUR 0.7 08/03/2014    Mm Digital Screening Bilateral  09/19/2014  CLINICAL DATA:  Screening. EXAM: DIGITAL SCREENING BILATERAL MAMMOGRAM WITH CAD COMPARISON:  Previous exam(s). ACR Breast Density Category b: There are scattered areas of fibroglandular density. FINDINGS: There are no findings suspicious for malignancy. Images were  processed with CAD. IMPRESSION: No mammographic evidence of malignancy. A result letter of this screening mammogram will be mailed directly to the patient. RECOMMENDATION: Screening mammogram in one year. (Code:SM-B-01Y) BI-RADS CATEGORY  1: Negative. Electronically Signed   By: Pamelia Hoit M.D.   On: 09/19/2014 18:42       Assessment & Plan:   Problem List Items Addressed This Visit    Abnormal liver function tests    Ultrasound revealed fatty liver and hepatomegaly.  Referred to GI for further evaluation.  Discussed diet and weight loss.  Recheck liver panel.        Relevant Orders   Hepatic function panel (  Completed)   Colon cancer screening    Needs colonoscopy.  Will notify me when agreeable.  Schedule physical.       Diabetes ([place]) - Primary    Brought in no recorded sugar readings.  Has not been exercising.  Not watching her diet.  Discussed diet and exercise.  Discussed weight loss.  Has on some occasions missed her medications.  Check met b and a1c.  Discussed other treatment options.  Refer to endocrinology for further evaluation and treatment of her poorly controlled diabetes.        Relevant Medications   glipiZIDE (GLIPIZIDE XL) 5 MG 24 hr tablet   sitaGLIPtin (JANUVIA) 100 MG tablet   lisinopril (PRINIVIL,ZESTRIL) 10 MG tablet   metFORMIN (GLUCOPHAGE) 500 MG tablet   Other Relevant Orders   Hemoglobin A1c (Completed)   Basic metabolic panel (Completed)   Microalbumin / creatinine urine ratio (Completed)   Ambulatory referral to Endocrinology   Essential hypertension, benign    Blood pressure under good control.  Continue same medication regimen.  Follow pressures.  Follow metabolic panel.        Relevant Medications   lisinopril (PRINIVIL,ZESTRIL) 10 MG tablet   Hypercholesterolemia    Low cholesterol diet and exercise.  Follow lipid panel.        Relevant Medications   lisinopril (PRINIVIL,ZESTRIL) 10 MG tablet   Other Relevant Orders   Lipid panel  (Completed)   Severe obesity (BMI >= 40) ([facility])    Diet and exercise.  Follow.        Relevant Medications   glipiZIDE (GLIPIZIDE XL) 5 MG 24 hr tablet   sitaGLIPtin (JANUVIA) 100 MG tablet   metFORMIN (GLUCOPHAGE) 500 MG tablet   Stress    Handling things well.  Follow.            Einar Pheasant, MD

## 2015-01-28 NOTE — Progress Notes (Signed)
Pre-visit discussion using our clinic review tool. No additional management support is needed unless otherwise documented below in the visit note.  

## 2015-01-29 LAB — HEPATIC FUNCTION PANEL
ALK PHOS: 83 IU/L (ref 39–117)
ALT: 29 IU/L (ref 0–32)
AST: 22 IU/L (ref 0–40)
Albumin: 4.3 g/dL (ref 3.5–5.5)
Bilirubin Total: <0.2 mg/dL (ref 0.0–1.2)
Bilirubin, Direct: 0.07 mg/dL (ref 0.00–0.40)
TOTAL PROTEIN: 7 g/dL (ref 6.0–8.5)

## 2015-01-29 LAB — BASIC METABOLIC PANEL
BUN/Creatinine Ratio: 11 (ref 9–23)
BUN: 10 mg/dL (ref 6–24)
CALCIUM: 9.8 mg/dL (ref 8.7–10.2)
CHLORIDE: 96 mmol/L — AB (ref 97–106)
CO2: 22 mmol/L (ref 18–29)
Creatinine, Ser: 0.93 mg/dL (ref 0.57–1.00)
GFR, EST AFRICAN AMERICAN: 82 mL/min/{1.73_m2} (ref >59)
GFR, EST NON AFRICAN AMERICAN: 71 mL/min/{1.73_m2} (ref >59)
Glucose: 294 mg/dL — ABNORMAL HIGH (ref 65–99)
POTASSIUM: 4.7 mmol/L (ref 3.5–5.2)
SODIUM: 137 mmol/L (ref 136–144)

## 2015-01-29 LAB — LIPID PANEL
CHOL/HDL RATIO: 3.8 ratio (ref 0.0–4.4)
Cholesterol, Total: 184 mg/dL (ref 100–199)
HDL: 49 mg/dL (ref >39)
LDL Calculated: 115 mg/dL — ABNORMAL HIGH (ref 0–99)
TRIGLYCERIDES: 99 mg/dL (ref 0–149)
VLDL CHOLESTEROL CAL: 20 mg/dL (ref 5–40)

## 2015-01-29 LAB — HEMOGLOBIN A1C
ESTIMATED AVERAGE GLUCOSE: 266 mg/dL
HEMOGLOBIN A1C: 10.9 % — AB (ref 4.8–5.6)

## 2015-01-29 LAB — MICROALBUMIN / CREATININE URINE RATIO
Creatinine, Urine: 111.3 mg/dL
MICROALB/CREAT RATIO: 4.3 mg/g{creat} (ref 0.0–30.0)
Microalbumin, Urine: 4.8 ug/mL

## 2015-01-30 ENCOUNTER — Encounter: Payer: Self-pay | Admitting: Internal Medicine

## 2015-02-03 ENCOUNTER — Encounter: Payer: Self-pay | Admitting: Internal Medicine

## 2015-02-03 DIAGNOSIS — Z1211 Encounter for screening for malignant neoplasm of colon: Secondary | ICD-10-CM | POA: Insufficient documentation

## 2015-02-03 NOTE — Assessment & Plan Note (Signed)
Needs colonoscopy.  Will notify me when agreeable.  Schedule physical.

## 2015-02-03 NOTE — Assessment & Plan Note (Signed)
Low cholesterol diet and exercise.  Follow lipid panel.   

## 2015-02-03 NOTE — Assessment & Plan Note (Signed)
Blood pressure under good control.  Continue same medication regimen.  Follow pressures.  Follow metabolic panel.   

## 2015-02-03 NOTE — Assessment & Plan Note (Signed)
Ultrasound revealed fatty liver and hepatomegaly.  Referred to GI for further evaluation.  Discussed diet and weight loss.  Recheck liver panel.

## 2015-02-03 NOTE — Assessment & Plan Note (Signed)
Diet and exercise.  Follow.  

## 2015-02-03 NOTE — Assessment & Plan Note (Signed)
Handling things well.  Follow.   

## 2015-02-03 NOTE — Assessment & Plan Note (Signed)
Brought in no recorded sugar readings.  Has not been exercising.  Not watching her diet.  Discussed diet and exercise.  Discussed weight loss.  Has on some occasions missed her medications.  Check met b and a1c.  Discussed other treatment options.  Refer to endocrinology for further evaluation and treatment of her poorly controlled diabetes.

## 2015-02-13 ENCOUNTER — Other Ambulatory Visit: Payer: Self-pay

## 2015-02-13 ENCOUNTER — Encounter: Payer: Self-pay | Admitting: Internal Medicine

## 2015-02-13 ENCOUNTER — Telehealth: Payer: Self-pay | Admitting: Internal Medicine

## 2015-02-13 MED ORDER — SITAGLIPTIN PHOSPHATE 100 MG PO TABS: ORAL_TABLET | ORAL | Status: DC

## 2015-02-13 MED ORDER — METFORMIN HCL 500 MG PO TABS: ORAL_TABLET | ORAL | Status: DC

## 2015-02-13 MED ORDER — LISINOPRIL 10 MG PO TABS: ORAL_TABLET | ORAL | Status: DC

## 2015-02-13 NOTE — Telephone Encounter (Signed)
Pt called about needing a prescription refill for metFORMIN (GLUCOPHAGE) 500 MG tablet ,lisinopril (PRINIVIL,ZESTRIL) 10 MG tablet ,sitaGLIPtin (JANUVIA) 100 MG tablet .pharmacy is [facility] on [address]. Thank You!

## 2015-02-13 NOTE — Telephone Encounter (Signed)
Refill completed per patients request.

## 2015-02-14 NOTE — Telephone Encounter (Signed)
ALREADY CALL IN

## 2015-03-29 ENCOUNTER — Encounter: Payer: 59 | Admitting: Internal Medicine

## 2015-05-03 ENCOUNTER — Encounter: Payer: 59 | Admitting: Internal Medicine

## 2015-08-01 ENCOUNTER — Ambulatory Visit (INDEPENDENT_AMBULATORY_CARE_PROVIDER_SITE_OTHER): Payer: 59 | Admitting: Internal Medicine

## 2015-08-01 ENCOUNTER — Encounter: Payer: Self-pay | Admitting: Internal Medicine

## 2015-08-01 VITALS — BP 122/82 | HR 86 | Temp 98.4°F | Resp 14 | Ht 64.0 in | Wt 251.8 lb

## 2015-08-01 DIAGNOSIS — E7800 Pure hypercholesterolemia, unspecified: Secondary | ICD-10-CM

## 2015-08-01 DIAGNOSIS — E119 Type 2 diabetes mellitus without complications: Secondary | ICD-10-CM

## 2015-08-01 DIAGNOSIS — R7989 Other specified abnormal findings of blood chemistry: Secondary | ICD-10-CM

## 2015-08-01 DIAGNOSIS — Z658 Other specified problems related to psychosocial circumstances: Secondary | ICD-10-CM | POA: Diagnosis not present

## 2015-08-01 DIAGNOSIS — I10 Essential (primary) hypertension: Secondary | ICD-10-CM

## 2015-08-01 DIAGNOSIS — F439 Reaction to severe stress, unspecified: Secondary | ICD-10-CM

## 2015-08-01 DIAGNOSIS — R252 Cramp and spasm: Secondary | ICD-10-CM

## 2015-08-01 DIAGNOSIS — R945 Abnormal results of liver function studies: Secondary | ICD-10-CM

## 2015-08-01 NOTE — Patient Instructions (Signed)
Follow up appt with endocrinology 08/05/15 - 9:45

## 2015-08-01 NOTE — Progress Notes (Signed)
Pre visit review using our clinic review tool, if applicable. No additional management support is needed unless otherwise documented below in the visit note. 

## 2015-08-01 NOTE — Progress Notes (Signed)
Patient ID: Denise Mathis, female   DOB: [birth year], 55 y.o.   MRN: [id number]   Subjective:    Patient ID: Denise Mathis, female    DOB: 1960/10/22, 55 y.o.   MRN: [id number]  HPI  Patient here for a scheduled follow up.  She states she is doing relatively well.  Saw endocrinology previously.  They started her on 50 units of lantus.  She did not like taking this.  Only on metformin and [place] now.  Did not bring sugar readings to appt.  AM sugars averaging 175-180 and pm sugars averaging 160-200.  Has started adjusting her diet.  Decreased sweets.  Has started walking.  No chest pain or tightness.  No sob.  No acid reflux.  No abdominal pain or cramping.  Bowels stable.  Some cramping in her feet and legs.  Occurs mostly at niht.     Past Medical History  Diagnosis Date  . History of chicken pox   . Diabetes mellitus without complication ([place])   . Ulcer   . Hypertension   . Hypercholesterolemia   . Hx: UTI (urinary tract infection)    Past Surgical History  Procedure Laterality Date  . Cholecystectomy  2005  . Breast surgery  2012    biopsy  . Leg surgery Right 1/14    mass/lump removal & tube placed in leg  . Abdominal hysterectomy  2006    partial, for abnormal bleeding  . Breast biopsy Left 2012    stereo, negative   Family History  Problem Relation Age of Onset  . Prostate cancer Father   . Hyperlipidemia Father   . Heart disease Father   . Stroke Father   . Hypertension Father   . Diabetes Father   . Hyperlipidemia Mother   . Hypertension Mother   . Diabetes Mother   . Diabetes Sister   . Diabetes Brother     borderline  . Breast cancer Maternal Aunt 60  . Diabetes Maternal Grandmother   . Diabetes Maternal Grandfather   . Heart disease Paternal Grandmother   . Diabetes Paternal Grandmother   . Diabetes Paternal Grandfather    Social History   Social History  . Marital Status: Married    Spouse Name: N/A  . Number of Children: N/A  . Years of  Education: N/A   Social History Main Topics  . Smoking status: Never Smoker   . Smokeless tobacco: Never Used  . Alcohol Use: No  . Drug Use: No  . Sexual Activity: Not Asked   Other Topics Concern  . None   Social History Narrative    Outpatient Encounter Prescriptions as of 08/01/2015  Medication Sig  . glucose blood test strip Use as instructed  . insulin glargine (LANTUS) 100 unit/mL SOPN Inject 100 Units into the skin at bedtime.  Marland Kitchen lisinopril (PRINIVIL,ZESTRIL) 10 MG tablet Take 1 tablet by mouth  daily  . metFORMIN (GLUCOPHAGE) 500 MG tablet Take one tablet by mouth at breakfast, one tablet at  lunch and two tablets with  supper  . nystatin cream (MYCOSTATIN) Apply topically 2 (two) times daily.  . ONE TOUCH LANCETS MISC Use as instructed  . sitaGLIPtin (JANUVIA) 100 MG tablet Take 1 tablet by mouth  daily  . [DISCONTINUED] glipiZIDE (GLIPIZIDE XL) 5 MG 24 hr tablet Take 1 tablet by mouth  daily   No facility-administered encounter medications on file as of 08/01/2015.    Review of Systems  Constitutional: Negative for appetite change and  unexpected weight change.  HENT: Negative for congestion and sinus pressure.   Respiratory: Negative for cough, chest tightness and shortness of breath.   Cardiovascular: Negative for chest pain, palpitations and leg swelling.  Gastrointestinal: Negative for nausea, vomiting, abdominal pain and diarrhea.  Genitourinary: Negative for dysuria and difficulty urinating.  Musculoskeletal: Negative for back pain and joint swelling.  Skin: Negative for color change and rash.  Neurological: Negative for dizziness, light-headedness and headaches.  Psychiatric/Behavioral: Negative for dysphoric mood and agitation.       Objective:    Physical Exam  Constitutional: She appears well-developed and well-nourished. No distress.  HENT:  Nose: Nose normal.  Mouth/Throat: Oropharynx is clear and moist.  Neck: Neck supple. No thyromegaly present.   Cardiovascular: Normal rate and regular rhythm.   DP pulses palpable and equal bilateral.   Pulmonary/Chest: Breath sounds normal. No respiratory distress. She has no wheezes.  Abdominal: Soft. Bowel sounds are normal. There is no tenderness.  Musculoskeletal: She exhibits no edema or tenderness.  Lymphadenopathy:    She has no cervical adenopathy.  Skin: No rash noted. No erythema.  Psychiatric: She has a normal mood and affect. Her behavior is normal.    BP 122/82 mmHg  Pulse 86  Temp(Src) 98.4 F (36.9 C) (Oral)  Resp 14  Ht _0  (1.626 m)  Wt 251 lb 12.8 oz (114.216 kg)  BMI 43.20 kg/m2  SpO2 99% Wt Readings from Last 3 Encounters:  08/01/15 251 lb 12.8 oz (114.216 kg)  01/28/15 260 lb (117.935 kg)  08/03/14 265 lb 8 oz (120.43 kg)     Lab Results  Component Value Date   WBC 5.6 08/03/2014   HGB 13.3 08/03/2014   HCT 40.4 08/03/2014   PLT 234.0 08/03/2014   GLUCOSE 294* 01/28/2015   CHOL 184 01/28/2015   TRIG 99 01/28/2015   HDL 49 01/28/2015   LDLCALC 115* 01/28/2015   ALT 29 01/28/2015   AST 22 01/28/2015   NA 137 01/28/2015   K 4.7 01/28/2015   CL 96* 01/28/2015   CREATININE 0.93 01/28/2015   BUN 10 01/28/2015   CO2 22 01/28/2015   TSH 1.72 08/03/2014   HGBA1C 10.9* 01/28/2015   MICROALBUR 0.7 08/03/2014    Mm Digital Screening Bilateral  09/19/2014  CLINICAL DATA:  Screening. EXAM: DIGITAL SCREENING BILATERAL MAMMOGRAM WITH CAD COMPARISON:  Previous exam(s). ACR Breast Density Category b: There are scattered areas of fibroglandular density. FINDINGS: There are no findings suspicious for malignancy. Images were processed with CAD. IMPRESSION: No mammographic evidence of malignancy. A result letter of this screening mammogram will be mailed directly to the patient. RECOMMENDATION: Screening mammogram in one year. (Code:SM-B-01Y) BI-RADS CATEGORY  1: Negative. Electronically Signed   By: Pamelia Hoit M.D.   On: 09/19/2014 18:42       Assessment & Plan:    Problem List Items Addressed This Visit    Abnormal liver function tests    Ultrasound revealed fatty liver and hepatomegaly.  Referred to GI.  Discussed diet and weight loss.  Follow.       Diabetes ([place])    On januvia, metformin.  Not taking lantus.  Unable to increase metformin secondary to intolerance.  Brought in no recorded sugar readings.  States sugars are averaing 175-180 in the am and 160-200 in the pm.  Saw endocrinology.  Did not f/u as scheduled.  States was out of town.  Did not like using the lantus.  Check met b and a1c.  Schedule f/u with endocrinology.  She is trying to adjust her diet.  Has cut down on sweets.       Relevant Medications   insulin glargine (LANTUS) 100 unit/mL SOPN   Essential hypertension, benign - Primary    Blood pressure under good control.  Continue same medication regimen.  Follow pressures.  Follow metabolic panel.        Hypercholesterolemia    Low cholesterol diet and exercise.  Check lipid panel.       Leg cramps    Check metabolic panel.  Stay hydrated.  Stretches.       Severe obesity (BMI >= 40) ([facility])    Diet and exercise.  Follow.       Relevant Medications   insulin glargine (LANTUS) 100 unit/mL SOPN   Stress    Increased stress.  Feels she is handling things relatively well.  Follow.            Einar Pheasant, MD

## 2015-08-02 ENCOUNTER — Other Ambulatory Visit: Payer: Self-pay | Admitting: Internal Medicine

## 2015-08-03 LAB — CBC WITH DIFFERENTIAL/PLATELET
BASOS: 1 %
Basophils Absolute: 0 10*3/uL (ref 0.0–0.2)
EOS (ABSOLUTE): 0.1 10*3/uL (ref 0.0–0.4)
EOS: 2 %
HEMATOCRIT: 41.3 % (ref 34.0–46.6)
Hemoglobin: 13.9 g/dL (ref 11.1–15.9)
IMMATURE GRANULOCYTES: 0 %
Immature Grans (Abs): 0 10*3/uL (ref 0.0–0.1)
LYMPHS: 44 %
Lymphocytes Absolute: 2.3 10*3/uL (ref 0.7–3.1)
MCH: 28.2 pg (ref 26.6–33.0)
MCHC: 33.7 g/dL (ref 31.5–35.7)
MCV: 84 fL (ref 79–97)
Monocytes Absolute: 0.4 10*3/uL (ref 0.1–0.9)
Monocytes: 8 %
NEUTROS PCT: 45 %
Neutrophils Absolute: 2.3 10*3/uL (ref 1.4–7.0)
PLATELETS: 259 10*3/uL (ref 150–379)
RBC: 4.93 x10E6/uL (ref 3.77–5.28)
RDW: 12.9 % (ref 12.3–15.4)
WBC: 5.1 10*3/uL (ref 3.4–10.8)

## 2015-08-03 LAB — HEPATIC FUNCTION PANEL
ALT: 48 IU/L — AB (ref 0–32)
AST: 44 IU/L — ABNORMAL HIGH (ref 0–40)
Albumin: 4.5 g/dL (ref 3.5–5.5)
Alkaline Phosphatase: 62 IU/L (ref 39–117)
Bilirubin Total: 0.3 mg/dL (ref 0.0–1.2)
Bilirubin, Direct: 0.13 mg/dL (ref 0.00–0.40)
Total Protein: 7.1 g/dL (ref 6.0–8.5)

## 2015-08-03 LAB — HGB A1C W/O EAG: HEMOGLOBIN A1C: 11.3 % — AB (ref 4.8–5.6)

## 2015-08-03 LAB — MAGNESIUM: Magnesium: 1.5 mg/dL — ABNORMAL LOW (ref 1.6–2.3)

## 2015-08-03 LAB — BASIC METABOLIC PANEL
BUN/Creatinine Ratio: 11 (ref 9–23)
BUN: 9 mg/dL (ref 6–24)
CHLORIDE: 97 mmol/L (ref 96–106)
CO2: 21 mmol/L (ref 18–29)
Calcium: 10 mg/dL (ref 8.7–10.2)
Creatinine, Ser: 0.85 mg/dL (ref 0.57–1.00)
GFR, EST AFRICAN AMERICAN: 91 mL/min/{1.73_m2} (ref >59)
GFR, EST NON AFRICAN AMERICAN: 79 mL/min/{1.73_m2} (ref >59)
Glucose: 277 mg/dL — ABNORMAL HIGH (ref 65–99)
POTASSIUM: 4.8 mmol/L (ref 3.5–5.2)
SODIUM: 137 mmol/L (ref 134–144)

## 2015-08-03 LAB — LIPID PANEL W/O CHOL/HDL RATIO
Cholesterol, Total: 184 mg/dL (ref 100–199)
HDL: 47 mg/dL (ref >39)
LDL CALC: 106 mg/dL — AB (ref 0–99)
Triglycerides: 154 mg/dL — ABNORMAL HIGH (ref 0–149)
VLDL CHOLESTEROL CAL: 31 mg/dL (ref 5–40)

## 2015-08-03 LAB — TSH: TSH: 1.49 u[IU]/mL (ref 0.450–4.500)

## 2015-08-04 DIAGNOSIS — R252 Cramp and spasm: Secondary | ICD-10-CM | POA: Insufficient documentation

## 2015-08-04 NOTE — Assessment & Plan Note (Signed)
Diet and exercise.  Follow.  

## 2015-08-04 NOTE — Assessment & Plan Note (Signed)
Check metabolic panel.  Stay hydrated.  Stretches.

## 2015-08-04 NOTE — Assessment & Plan Note (Signed)
Increased stress.  Feels she is handling things relatively well.  Follow.   

## 2015-08-04 NOTE — Assessment & Plan Note (Signed)
Low cholesterol diet and exercise.  Check lipid panel.   

## 2015-08-04 NOTE — Assessment & Plan Note (Signed)
Ultrasound revealed fatty liver and hepatomegaly.  Referred to GI.  Discussed diet and weight loss.  Follow.

## 2015-08-04 NOTE — Assessment & Plan Note (Addendum)
On januvia, metformin.  Not taking lantus.  Unable to increase metformin secondary to intolerance.  Brought in no recorded sugar readings.  States sugars are averaing 175-180 in the am and 160-200 in the pm.  Saw endocrinology.  Did not f/u as scheduled.  States was out of town.  Did not like using the lantus.  Check met b and a1c.  Schedule f/u with endocrinology.  She is trying to adjust her diet.  Has cut down on sweets.

## 2015-08-04 NOTE — Assessment & Plan Note (Signed)
Blood pressure under good control.  Continue same medication regimen.  Follow pressures.  Follow metabolic panel.   

## 2015-08-06 ENCOUNTER — Telehealth: Payer: Self-pay | Admitting: Internal Medicine

## 2015-08-06 NOTE — Telephone Encounter (Signed)
Denise Mathis [phone] [id number] [facility] needing pt lab work and last office notes. Please fax to Atten Denise Mathis [phone]. Thank you!

## 2015-08-06 NOTE — Telephone Encounter (Signed)
Spoke with Denise Mathis.  Mad her aware that last last were from October 2016.  Sent only recent OV.

## 2015-08-08 ENCOUNTER — Other Ambulatory Visit: Payer: Self-pay | Admitting: Internal Medicine

## 2015-08-13 ENCOUNTER — Telehealth: Payer: Self-pay | Admitting: *Deleted

## 2015-08-13 NOTE — Telephone Encounter (Signed)
Patient stated that [facility] not [facility] the blood work results.

## 2015-08-13 NOTE — Telephone Encounter (Signed)
refaxed labs to the number provided before to [facility]

## 2015-08-19 ENCOUNTER — Other Ambulatory Visit: Payer: Self-pay | Admitting: Internal Medicine

## 2015-08-19 DIAGNOSIS — R945 Abnormal results of liver function studies: Secondary | ICD-10-CM

## 2015-08-19 DIAGNOSIS — R7989 Other specified abnormal findings of blood chemistry: Secondary | ICD-10-CM

## 2015-08-19 NOTE — Progress Notes (Signed)
Order placed for f/u liver panel.  

## 2015-08-20 ENCOUNTER — Encounter: Payer: Self-pay | Admitting: *Deleted

## 2015-08-28 NOTE — Telephone Encounter (Signed)
Unread mychart message mailed to patient 

## 2015-10-18 ENCOUNTER — Encounter: Payer: 59 | Admitting: Internal Medicine

## 2015-11-06 ENCOUNTER — Encounter: Payer: 59 | Admitting: Internal Medicine

## 2015-11-06 DIAGNOSIS — Z0289 Encounter for other administrative examinations: Secondary | ICD-10-CM

## 2016-12-14 ENCOUNTER — Ambulatory Visit: Payer: Self-pay | Admitting: Internal Medicine

## 2017-02-26 ENCOUNTER — Encounter: Payer: Self-pay | Admitting: Emergency Medicine

## 2017-02-26 ENCOUNTER — Other Ambulatory Visit: Payer: Self-pay

## 2017-02-26 ENCOUNTER — Emergency Department
Admission: EM | Admit: 2017-02-26 | Discharge: 2017-02-26 | Disposition: A | Discharge status: Home / Self Care | Payer: Self-pay | Attending: Emergency Medicine | Admitting: Emergency Medicine

## 2017-02-26 DIAGNOSIS — M5416 Radiculopathy, lumbar region: Secondary | ICD-10-CM | POA: Insufficient documentation

## 2017-02-26 DIAGNOSIS — R200 Anesthesia of skin: Secondary | ICD-10-CM | POA: Insufficient documentation

## 2017-02-26 DIAGNOSIS — Z79899 Other long term (current) drug therapy: Secondary | ICD-10-CM | POA: Insufficient documentation

## 2017-02-26 DIAGNOSIS — I10 Essential (primary) hypertension: Secondary | ICD-10-CM | POA: Insufficient documentation

## 2017-02-26 DIAGNOSIS — Z794 Long term (current) use of insulin: Secondary | ICD-10-CM | POA: Insufficient documentation

## 2017-02-26 DIAGNOSIS — E119 Type 2 diabetes mellitus without complications: Secondary | ICD-10-CM | POA: Insufficient documentation

## 2017-02-26 MED ORDER — PREDNISONE 10 MG PO TABS: 10.0000 mg | ORAL_TABLET | Freq: Every day | ORAL | 0 refills | Status: AC

## 2017-02-26 MED ORDER — GABAPENTIN 300 MG PO CAPS: 300.0000 mg | ORAL_CAPSULE | Freq: Every day | ORAL | 0 refills | Status: AC

## 2017-02-26 NOTE — Discharge Instructions (Signed)
Please take medications as prescribed.  Follow-up with primary care provider in 2-3 weeks if no improvement.  Return to the emergency department immediately for any increasing pain, numbness, weakness, loss of bowel or bladder symptoms.

## 2017-02-26 NOTE — ED Triage Notes (Signed)
Pt c/o Right leg pain x several months. Pt states that she pain has been worse over the past few weeks. Pt states that she has used OTC medications without relief. Pt ambulatory to triage with cane. Pt in NAD at this time.

## 2017-02-26 NOTE — ED Provider Notes (Signed)
[facility] EMERGENCY DEPARTMENT Provider Note   CSN: [id number] Arrival date & time: 02/26/17  1535     History   Chief Complaint Chief Complaint  Patient presents with  . Leg Pain    HPI Denise Mathis is a 56 y.o. female presents to the emergency department for evaluation of anterior thigh numbness.  She said symptoms of burning numbness and tingling that have been intermittent for the last 3-4 months.  Medications such as ibuprofen, Tylenol and salon Drue Novelaz have been controlling the pain up into the last few weeks.  Recently she has had no improvement with ibuprofen.  She denies any back pain, trauma, injury, fevers, weakness, loss of bowel or bladder symptoms.  The pain is moderate and increased with activity.  She denies any swelling, warmth, erythema, edema throughout the right lower extremity.  No history of blood clots.  HPI  Past Medical History:  Diagnosis Date  . Diabetes mellitus without complication (HCC)   . History of chicken pox   . Hx: UTI (urinary tract infection)   . Hypercholesterolemia   . Hypertension   . Ulcer     Patient Active Problem List   Diagnosis Date Noted  . Leg cramps 08/04/2015  . Colon cancer screening 02/03/2015  . Health care maintenance 08/05/2014  . Severe obesity (BMI >= 40) (HCC) 04/08/2014  . Stress 04/08/2014  . Nausea with vomiting 04/20/2013  . Abnormal liver function tests 01/05/2013  . Diabetes (HCC) 11/17/2012  . Essential hypertension, benign 11/17/2012  . Hypercholesterolemia 11/17/2012  . Shoulder pain 11/17/2012    Past Surgical History:  Procedure Laterality Date  . ABDOMINAL HYSTERECTOMY  2006   partial, for abnormal bleeding  . BREAST BIOPSY Left 2012   stereo, negative  . BREAST SURGERY  2012   biopsy  . CHOLECYSTECTOMY  2005  . LEG SURGERY Right 1/14   mass/lump removal & tube placed in leg    OB History    No data available       Home Medications    Prior to Admission  medications   Medication Sig Start Date End Date Taking? Authorizing Provider  gabapentin (NEURONTIN) 300 MG capsule Take 1 capsule (300 mg total) by mouth at bedtime. 02/26/17 02/26/18  Amador CunasGaines, Nikeisha Klutz C, PA-C  glucose blood test strip Use as instructed 01/09/13   Dale DurhamScott, Charlene, MD  insulin glargine (LANTUS) 100 unit/mL SOPN Inject 100 Units into the skin at bedtime.    [provider]  JANUVIA 100 MG tablet Take 1 tablet by mouth  daily 08/08/15   Dale DurhamScott, Charlene, MD  lisinopril (PRINIVIL,ZESTRIL) 10 MG tablet Take 1 tablet by mouth  daily 08/08/15   Dale DurhamScott, Charlene, MD  metFORMIN (GLUCOPHAGE) 500 MG tablet Take 1 tablet by mouth at  breakfast, 1 tablet at   lunch and 2 tablets with   supper 08/08/15   Dale DurhamScott, Charlene, MD  nystatin cream (MYCOSTATIN) Apply topically 2 (two) times daily. 08/03/14   Dale DurhamScott, Charlene, MD  ONE TOUCH LANCETS MISC Use as instructed 01/09/13   Dale DurhamScott, Charlene, MD  predniSONE (DELTASONE) 10 MG tablet Take 1 tablet (10 mg total) by mouth daily. 6,5,4,3,2,1 six day taper 02/26/17   Evon SlackGaines, Jemmie Rhinehart C, PA-C    Family History Family History  Problem Relation Age of Onset  . Prostate cancer Father   . Hyperlipidemia Father   . Heart disease Father   . Stroke Father   . Hypertension Father   . Diabetes Father   .  Hyperlipidemia Mother   . Hypertension Mother   . Diabetes Mother   . Diabetes Sister   . Diabetes Brother        borderline  . Breast cancer Maternal Aunt 60  . Diabetes Maternal Grandmother   . Diabetes Maternal Grandfather   . Heart disease Paternal Grandmother   . Diabetes Paternal Grandmother   . Diabetes Paternal Grandfather     Social History Social History   Tobacco Use  . Smoking status: Never Smoker  . Smokeless tobacco: Never Used  Substance Use Topics  . Alcohol use: No    Alcohol/week: 0.0 oz  . Drug use: No     Allergies   Green dyes   Review of Systems Review of Systems  Constitutional: Negative for fever.    Respiratory: Negative for shortness of breath.   Cardiovascular: Negative for chest pain.  Gastrointestinal: Negative for abdominal pain.  Genitourinary: Negative for difficulty urinating, dysuria and urgency.  Musculoskeletal: Negative for back pain, joint swelling and myalgias.  Skin: Negative for rash.  Neurological: Positive for numbness. Negative for dizziness and headaches.     Physical Exam Updated Vital Signs BP (!) 158/79 (BP Location: Left Arm)   Pulse 79   Temp 98.3 F (36.8 C) (Oral)   Resp 16   Ht 5\' 3"  (1.6 m)   Wt 99.8 kg (220 lb)   SpO2 99%   BMI 38.97 kg/m   Physical Exam  Constitutional: She is oriented to person, place, and time. She appears well-developed and well-nourished. No distress.  HENT:  Head: Normocephalic and atraumatic.  Mouth/Throat: Oropharynx is clear and moist.  Eyes: Conjunctivae are normal. Right eye exhibits no discharge. Left eye exhibits no discharge.  Neck: Normal range of motion.  Cardiovascular: Normal rate.  Pulmonary/Chest: No respiratory distress.  Musculoskeletal: Normal range of motion. She exhibits no deformity.  Examination of the right lower extremity shows patient has full internal and external rotation of the right hip with no discomfort.  She has slight sensation discrepancy in the right anterior lateral thigh when compared to the left.  There is no swelling warmth erythema or edema throughout the lower extremities.  She has 5 out of 5 strength with ankle plantarflexion, dorsiflexion, knee extension, hip abduction, hip abduction with bilateral lower extremities.  She is nontender along the trochanteric bursa.  Negative Homans sign bilaterally.  Examination of lumbar spine shows no spinous process tenderness.  No SI joint tenderness.  She has good range of motion of the lumbar spine with no discomfort.  She is ambulatory with a cane.  Neurological: She is alert and oriented to person, place, and time. She has normal reflexes.   Skin: Skin is warm and dry. No rash noted. No erythema.  Psychiatric: She has a normal mood and affect. Her behavior is normal. Thought content normal.     ED Treatments / Results  Labs (all labs ordered are listed, but only abnormal results are displayed) Labs Reviewed - No data to display  EKG  EKG Interpretation None       Radiology No results found.  Procedures Procedures (including critical care time)  Medications Ordered in ED Medications - No data to display   Initial Impression / Assessment and Plan / ED Course  I have reviewed the triage vital signs and the nursing notes.  Pertinent labs & imaging results that were available during my care of the patient were reviewed by me and considered in my medical decision making (see chart  for details).     56 year old female with 3-4 months of right lower extremity and lumbar radiculopathy.  No weakness on today's exam.  Mild sensation discrepancy on the right anterolateral thigh.  Patient has had no improvement with over-the-counter anti-inflammatory medications.  Will start 6-day steroid taper along with gabapentin at nighttime.  She will follow-up with PCP.  She is educated on signs and symptoms to return to the ED for.  Final Clinical Impressions(s) / ED Diagnoses   Final diagnoses:  Right lumbar radiculopathy    ED Discharge Orders        Ordered    predniSONE (DELTASONE) 10 MG tablet  Daily     02/26/17 1616    gabapentin (NEURONTIN) 300 MG capsule  Daily at bedtime     02/26/17 1616       Evon Slack, [place] 02/26/17 1627    Phineas Semen, MD 02/26/17 2314

## 2020-07-25 ENCOUNTER — Emergency Department
Admission: EM | Admit: 2020-07-25 | Discharge: 2020-07-25 | Disposition: A | Discharge status: Home / Self Care | Payer: No Typology Code available for payment source | Attending: Emergency Medicine | Admitting: Emergency Medicine

## 2020-07-25 ENCOUNTER — Other Ambulatory Visit: Payer: Self-pay

## 2020-07-25 ENCOUNTER — Encounter: Payer: Self-pay | Admitting: Emergency Medicine

## 2020-07-25 DIAGNOSIS — I10 Essential (primary) hypertension: Secondary | ICD-10-CM | POA: Diagnosis not present

## 2020-07-25 DIAGNOSIS — Z79899 Other long term (current) drug therapy: Secondary | ICD-10-CM | POA: Insufficient documentation

## 2020-07-25 DIAGNOSIS — W01198A Fall on same level from slipping, tripping and stumbling with subsequent striking against other object, initial encounter: Secondary | ICD-10-CM | POA: Diagnosis not present

## 2020-07-25 DIAGNOSIS — E1169 Type 2 diabetes mellitus with other specified complication: Secondary | ICD-10-CM | POA: Diagnosis not present

## 2020-07-25 DIAGNOSIS — Y990 Civilian activity done for income or pay: Secondary | ICD-10-CM | POA: Insufficient documentation

## 2020-07-25 DIAGNOSIS — E785 Hyperlipidemia, unspecified: Secondary | ICD-10-CM | POA: Insufficient documentation

## 2020-07-25 DIAGNOSIS — Z794 Long term (current) use of insulin: Secondary | ICD-10-CM | POA: Diagnosis not present

## 2020-07-25 DIAGNOSIS — Z7984 Long term (current) use of oral hypoglycemic drugs: Secondary | ICD-10-CM | POA: Insufficient documentation

## 2020-07-25 DIAGNOSIS — S01112A Laceration without foreign body of left eyelid and periocular area, initial encounter: Secondary | ICD-10-CM | POA: Diagnosis not present

## 2020-07-25 DIAGNOSIS — M25562 Pain in left knee: Secondary | ICD-10-CM | POA: Insufficient documentation

## 2020-07-25 DIAGNOSIS — S0990XA Unspecified injury of head, initial encounter: Secondary | ICD-10-CM | POA: Diagnosis present

## 2020-07-25 NOTE — ED Notes (Signed)
Patient and visitor provided with water. Resting comfortably. Denies other needs at this time.

## 2020-07-25 NOTE — ED Notes (Signed)
ED Provider at bedside. 

## 2020-07-25 NOTE — ED Notes (Signed)
Pt reports injury while at work; employed with [employer] (no testing required per workers comp profile)

## 2020-07-25 NOTE — ED Provider Notes (Signed)
[facility] Emergency Department Provider Note  Time seen: 9:56 PM  I have reviewed the triage vital signs and the nursing notes.   HISTORY  Chief Complaint Head Injury   HPI Denise Mathis is a 60 y.o. female with a past medical history of diabetes, hypertension, hyperlipidemia, presents to the emergency department for head injury.  According to the patient around 6 PM tonight  she was getting up from a table and tripped over the table cough hitting her head on the ground.  Patient suffered a cut to her left eyebrow as well as a hematoma to the left eyebrow.  Denies any loss of consciousness dizziness headache nausea or vomiting.  Does state some mild left knee pain as well.  Past Medical History:  Diagnosis Date  . Diabetes mellitus without complication (HCC)   . History of chicken pox   . Hx: UTI (urinary tract infection)   . Hypercholesterolemia   . Hypertension   . Ulcer     Patient Active Problem List   Diagnosis Date Noted  . Leg cramps 08/04/2015  . Colon cancer screening 02/03/2015  . Health care maintenance 08/05/2014  . Severe obesity (BMI >= 40) (HCC) 04/08/2014  . Stress 04/08/2014  . Nausea with vomiting 04/20/2013  . Abnormal liver function tests 01/05/2013  . Diabetes (HCC) 11/17/2012  . Essential hypertension, benign 11/17/2012  . Hypercholesterolemia 11/17/2012  . Shoulder pain 11/17/2012    Past Surgical History:  Procedure Laterality Date  . ABDOMINAL HYSTERECTOMY  2006   partial, for abnormal bleeding  . BREAST BIOPSY Left 2012   stereo, negative  . BREAST SURGERY  2012   biopsy  . CHOLECYSTECTOMY  2005  . LEG SURGERY Right 1/14   mass/lump removal & tube placed in leg    Prior to Admission medications   Medication Sig Start Date End Date Taking? Authorizing Provider  gabapentin (NEURONTIN) 300 MG capsule Take 1 capsule (300 mg total) by mouth at bedtime. 02/26/17 02/26/18  Amador Cunas C, PA-C  glucose blood  test strip Use as instructed 01/09/13   Dale Burnham, MD  insulin glargine (LANTUS) 100 unit/mL SOPN Inject 100 Units into the skin at bedtime.    [provider]  JANUVIA 100 MG tablet Take 1 tablet by mouth  daily 08/08/15   Dale [place], MD  lisinopril (PRINIVIL,ZESTRIL) 10 MG tablet Take 1 tablet by mouth  daily 08/08/15   Dale [place], MD  metFORMIN (GLUCOPHAGE) 500 MG tablet Take 1 tablet by mouth at  breakfast, 1 tablet at   lunch and 2 tablets with   supper 08/08/15   Dale Fulton, MD  nystatin cream (MYCOSTATIN) Apply topically 2 (two) times daily. 08/03/14   Dale Ahmeek, MD  ONE TOUCH LANCETS MISC Use as instructed 01/09/13   Dale [place], MD  predniSONE (DELTASONE) 10 MG tablet Take 1 tablet (10 mg total) by mouth daily. 6,5,4,3,2,1 six day taper 02/26/17   Evon Slack, PA-C    Allergies  Allergen Reactions  . Green Dyes Rash    Family History  Problem Relation Age of Onset  . Prostate cancer Father   . Hyperlipidemia Father   . Heart disease Father   . Stroke Father   . Hypertension Father   . Diabetes Father   . Hyperlipidemia Mother   . Hypertension Mother   . Diabetes Mother   . Diabetes Sister   . Diabetes Brother        borderline  . Breast  cancer Maternal Aunt 60  . Diabetes Maternal Grandmother   . Diabetes Maternal Grandfather   . Heart disease Paternal Grandmother   . Diabetes Paternal Grandmother   . Diabetes Paternal Grandfather     Social History Social History   Tobacco Use  . Smoking status: Never Smoker  . Smokeless tobacco: Never Used  Vaping Use  . Vaping Use: Never used  Substance Use Topics  . Alcohol use: No    Alcohol/week: 0.0 standard drinks  . Drug use: No    Review of Systems Constitutional: Head injury.  Negative LOC Cardiovascular: Negative for chest pain. Respiratory: Negative for shortness of breath. Gastrointestinal: Negative for abdominal pain, vomiting  Musculoskeletal: Negative for  musculoskeletal complaints Skin: Laceration to left eyebrow Neurological: Negative for headache All other ROS negative  ____________________________________________   PHYSICAL EXAM:  VITAL SIGNS: ED Triage Vitals  Enc Vitals Group     BP 07/25/20 2009 (!) 171/85     Pulse Rate 07/25/20 2009 98     Resp 07/25/20 2009 20     Temp 07/25/20 2009 98.5 F (36.9 C)     Temp Source 07/25/20 2009 Oral     SpO2 07/25/20 2009 100 %     Weight 07/25/20 2010 190 lb (86.2 kg)     Height 07/25/20 2010 5\' 4"  (1.626 m)     Head Circumference --      Peak Flow --      Pain Score 07/25/20 2009 4     Pain Loc --      Pain Edu? --      Excl. in GC? --    Constitutional: Patient is awake and alert.  No distress.  Calm cooperative. Eyes: Normal exam.  Patient does have a moderate hematoma to left eyebrow with an approximate 2 cm laceration overlying the left eyebrow currently hemostatic. ENT      Head: Hematoma/laceration as described above.  Face otherwise atraumatic.      Mouth/Throat: Mucous membranes are moist. Cardiovascular: Normal rate, regular rhythm.  Respiratory: Normal respiratory effort without tachypnea nor retractions. Breath sounds are clear  Gastrointestinal: Soft and nontender. No distention. Musculoskeletal: Nontender with normal range of motion in all extremities.  Neurologic:  Normal speech and language. No gross focal neurologic deficits  Skin: Patient has hemostatic 1.5-2cm laceration left eyebrow. Psychiatric: Mood and affect are normal.   ____________________________________________   INITIAL IMPRESSION / ASSESSMENT AND PLAN / ED COURSE  Pertinent labs & imaging results that were available during my care of the patient were reviewed by me and considered in my medical decision making (see chart for details).   Patient presents to the emergency department after a fall.  Patient did hit her head but denies LOC denies nausea or vomiting denies headache.  No  anticoagulation.  Overall patient appears well she does have a mild to moderate hematoma to left eyebrow with overlying abrasion/laceration approximate 1.5 to 2 cm in length hemostatic.  Patient was complaining of some mild left knee pain but states that is since resolved while in the emergency department.  Has great range of motion in the knee no signs of contusion or effusion on examination.  Given the patient is now 4 hours status post her injury with no headache no weakness or numbness nausea vomiting or changes in mental state I believe the patient does not require CT imaging.  Patient's eyebrow laceration is approximately 1.5 to 2 cm in length.  I suggested to the patient multiple times  that we should repair this to prevent scarring.  Patient states she does not want it repaired, does not want sutures.  I do believe this repair would be purely for cosmetic purposes I explained to the patient multiple times that we could limit and nearly eliminate a scar by suturing as it lies within the eyebrow, but patient understands and does not wish for laceration repair.  Discussed with the patient keeping the area clean and covering with Neosporin and a bandage until it is healed.  Patient agreeable.  Denise Mathis was evaluated in Emergency Department on 07/25/2020 for the symptoms described in the history of present illness. She was evaluated in the context of the global COVID-19 pandemic, which necessitated consideration that the patient might be at risk for infection with the SARS-CoV-2 virus that causes COVID-19. Institutional protocols and algorithms that pertain to the evaluation of patients at risk for COVID-19 are in a state of rapid change based on information released by regulatory bodies including the CDC and federal and state organizations. These policies and algorithms were followed during the patient's care in the ED.  ____________________________________________   FINAL CLINICAL IMPRESSION(S) /  ED DIAGNOSES  Fall Head injury   Minna Antis, MD 07/25/20 2202

## 2020-07-25 NOTE — ED Triage Notes (Addendum)
Pt in via POV, reports fall at work, reports getting up from table, tripping on long table cloth, landing on concrete/brick surface.  Reports pain to left knee, approximate 2" laceration noted to left eyebrow, bleeding controlled at this time.    Pt denies LOC, denies any dizziness or changes to vision since the fall.  Denies blood thinners.  Ambulatory to triage, NAD noted at this time.    Pt does wish to file workmans comp; profile printed and placed on chart.

## 2020-10-16 ENCOUNTER — Encounter: Payer: Self-pay | Admitting: *Deleted

## 2020-10-16 ENCOUNTER — Other Ambulatory Visit: Payer: Self-pay

## 2020-10-16 ENCOUNTER — Ambulatory Visit
Admission: RE | Admit: 2020-10-16 | Discharge: 2020-10-16 | Disposition: A | Discharge status: Home / Self Care | Payer: Self-pay | Source: Ambulatory Visit | Attending: Oncology | Admitting: Oncology

## 2020-10-16 ENCOUNTER — Ambulatory Visit: Payer: Self-pay | Attending: Oncology | Admitting: *Deleted

## 2020-10-16 VITALS — BP 151/86 | HR 81 | Temp 97.4°F | Ht 64.0 in | Wt 202.5 lb

## 2020-10-16 DIAGNOSIS — Z0000 Encounter for general adult medical examination without abnormal findings: Secondary | ICD-10-CM

## 2020-10-16 NOTE — Progress Notes (Signed)
  Subjective:     Patient ID: Denise Mathis, female   DOB: [birth year], 60 y.o.   MRN: [id number]  HPI  [facility] Medical History Record - 10/16/20 0919       Breast History   Screening cycle New    Provider ([facility]) Phineas Real    Initial Mammogram 10/16/20    Last Mammogram Annual    Provider (Mammogram)  Delford Field    Recent Breast Symptoms None      Breast Cancer History   Comments/Details maternal aunt 37's;  second cousin in her 34's      Previous History of Breast Problems   Breast Surgery or Biopsy Left   benign stereo bx   Breast Implants N/A    BSE Done Monthly      Gynecological/Obstetrical History   LMP --   30's   Is there any chance that the client could be pregnant?  No    Age at menarche 66-19    Age at menopause n/a    Age at first live birth n/a    DES Exposure No    Cervical, Uterine or Ovarian cancer No    Family history of Cervial, Uterine or Ovarian cancer No    Hysterectomy Yes    Cervix removed Yes    Ovaries removed Yes    Laser/Cryosurgery No    Current method of birth control None    Current method of Estrogen/Hormone replacement None    Smoking history None    Comments no insurance               Review of Systems     Objective:   Physical Exam Chest:  Breasts:    Breasts are asymmetrical.     Right: No swelling, bleeding, inverted nipple, mass, nipple discharge, skin change, tenderness, axillary adenopathy or supraclavicular adenopathy.     Left: No swelling, bleeding, inverted nipple, mass, nipple discharge, skin change, tenderness, axillary adenopathy or supraclavicular adenopathy.     Comments: Right breast larger than the left Lymphadenopathy:     Upper Body:     Right upper body: No supraclavicular or axillary adenopathy.     Left upper body: No supraclavicular or axillary adenopathy.      Assessment:     60 year old female presents to [facility] for clinical breast exam and mammogram.  Clinical breast exam unremarkable. Taught  self breast awareness.  Patient with a history of a total hysterectomy.  Pap omitted per protocol.  Patient has been screened for eligibility.  She does not have any insurance, Medicare or Medicaid.  She also meets financial eligibility.   Risk Assessment     Risk Scores       10/16/2020   Last edited by: Jim Like, RN   5-year risk: 1.1 %   Lifetime risk: 5.8 %                  Plan:     Screening mammogram ordered.  Will follow up per [facility] protocol.

## 2020-10-16 NOTE — Patient Instructions (Signed)
Gave patient hand-out, Women Staying Healthy, Active and Well from [facility], with education on breast health, pap smears, heart and colon health. 

## 2020-10-18 ENCOUNTER — Other Ambulatory Visit: Payer: Self-pay

## 2020-10-18 DIAGNOSIS — N63 Unspecified lump in unspecified breast: Secondary | ICD-10-CM

## 2020-10-18 DIAGNOSIS — R920 Mammographic microcalcification found on diagnostic imaging of breast: Secondary | ICD-10-CM

## 2020-10-29 ENCOUNTER — Ambulatory Visit
Admission: RE | Admit: 2020-10-29 | Discharge: 2020-10-29 | Disposition: A | Discharge status: Home / Self Care | Payer: Self-pay | Source: Ambulatory Visit | Attending: Oncology | Admitting: Oncology

## 2020-10-29 ENCOUNTER — Other Ambulatory Visit: Payer: Self-pay

## 2020-10-29 DIAGNOSIS — R920 Mammographic microcalcification found on diagnostic imaging of breast: Secondary | ICD-10-CM | POA: Insufficient documentation

## 2020-10-29 DIAGNOSIS — N63 Unspecified lump in unspecified breast: Secondary | ICD-10-CM

## 2020-10-30 ENCOUNTER — Other Ambulatory Visit: Payer: Self-pay | Admitting: *Deleted

## 2020-10-30 DIAGNOSIS — R920 Mammographic microcalcification found on diagnostic imaging of breast: Secondary | ICD-10-CM

## 2020-10-31 ENCOUNTER — Other Ambulatory Visit: Payer: Self-pay | Admitting: *Deleted

## 2020-10-31 DIAGNOSIS — R920 Mammographic microcalcification found on diagnostic imaging of breast: Secondary | ICD-10-CM

## 2020-11-26 ENCOUNTER — Telehealth: Payer: Self-pay | Admitting: *Deleted

## 2021-03-18 ENCOUNTER — Encounter: Payer: Self-pay | Admitting: *Deleted

## 2021-03-18 NOTE — Progress Notes (Signed)
Patient had a birads 4 mammogram and has not been set up for a biopsy.  A certified letter was mailed from the [facility] to inform patient to schedule her biopsy.  I called patient today.  States her son has had 4 major surgeries and at this time she is not able to come in for the biopsy, but has not forgotten about it.  State she will call the breast center back when she is ready to schedule.

## 2022-05-19 IMAGING — MG DIGITAL DIAGNOSTIC BILAT W/ TOMO W/ CAD
8 of 12 series · 8 of 24 positions shown · non-contrast
Comparison: Prior films

CLINICAL DATA: Callback from screening mammogram for bilateral
breast calcifications and right breast asymmetry.

EXAM:
DIGITAL DIAGNOSTIC BILATERAL MAMMOGRAM WITH TOMOSYNTHESIS AND CAD
TECHNIQUE: Bilateral digital diagnostic mammography and breast tomosynthesis
was performed. The images were evaluated with computer-aided
detection.

[R CC]
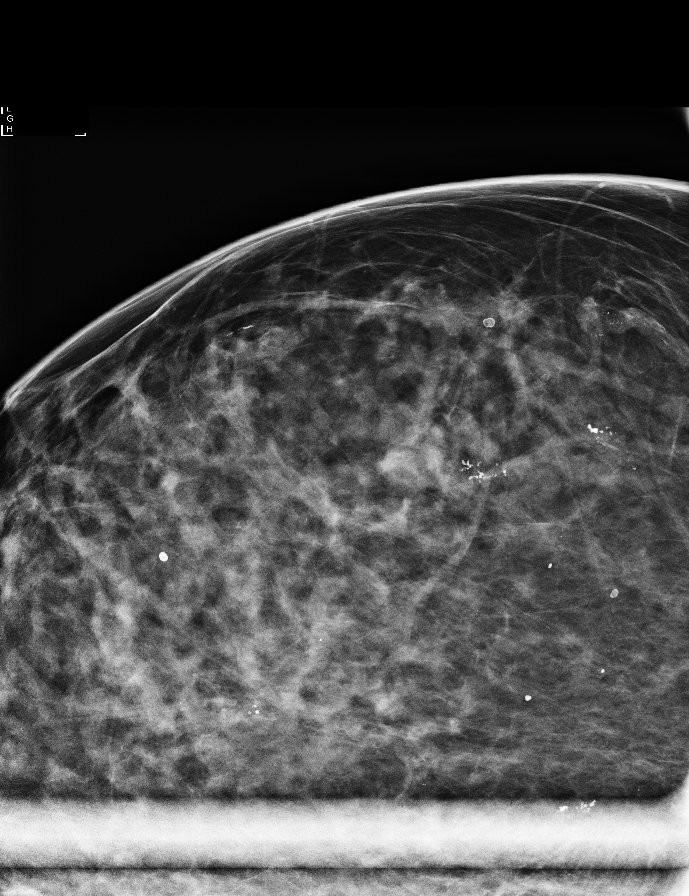

[R ML (1 of 2)]
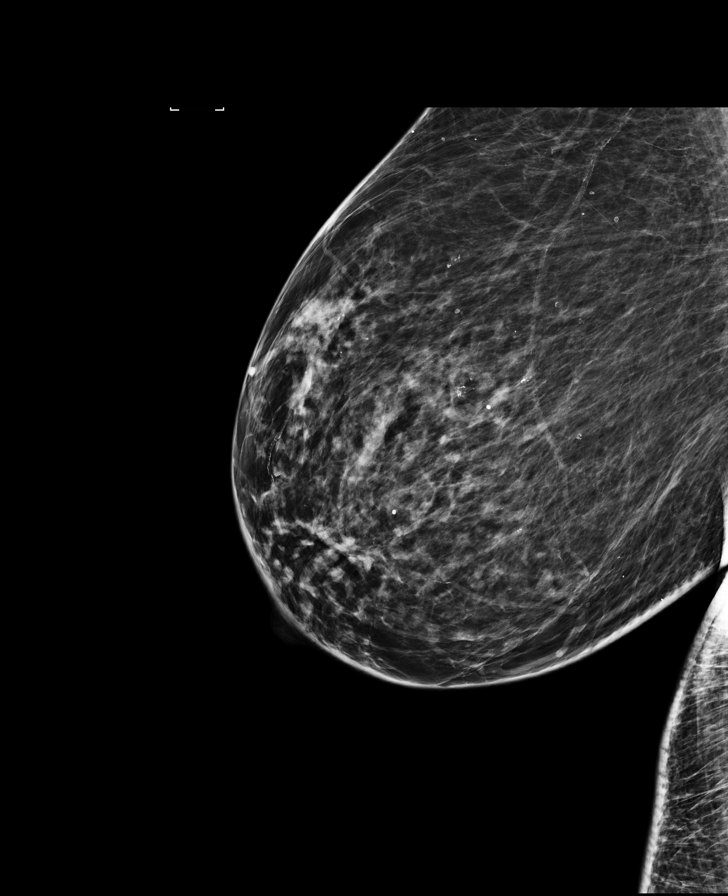

[L ML (1 of 2)]
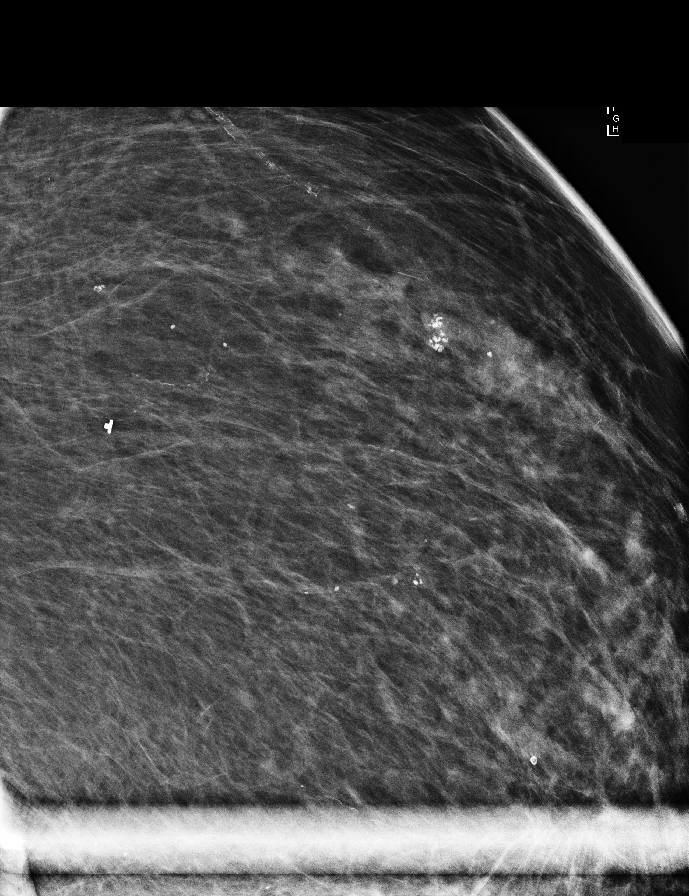

[L ML (2 of 2)]
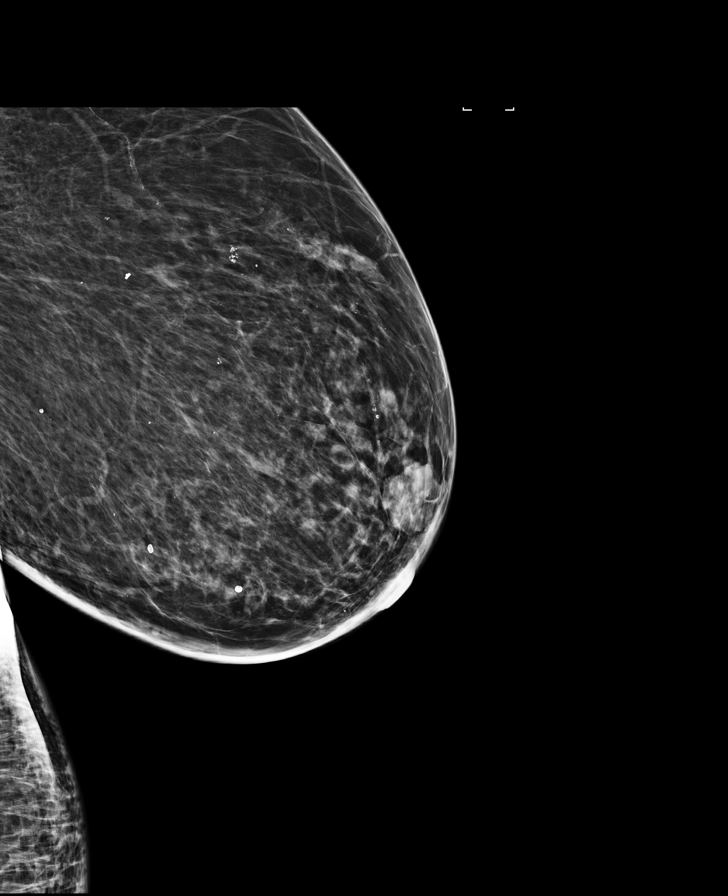

[R ML (2 of 2)]
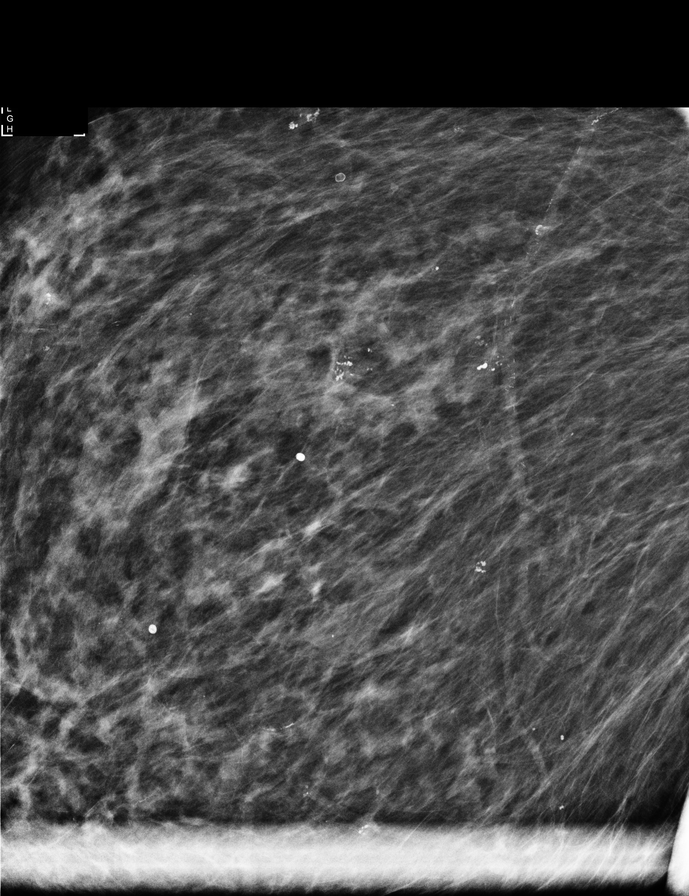

[L CC]
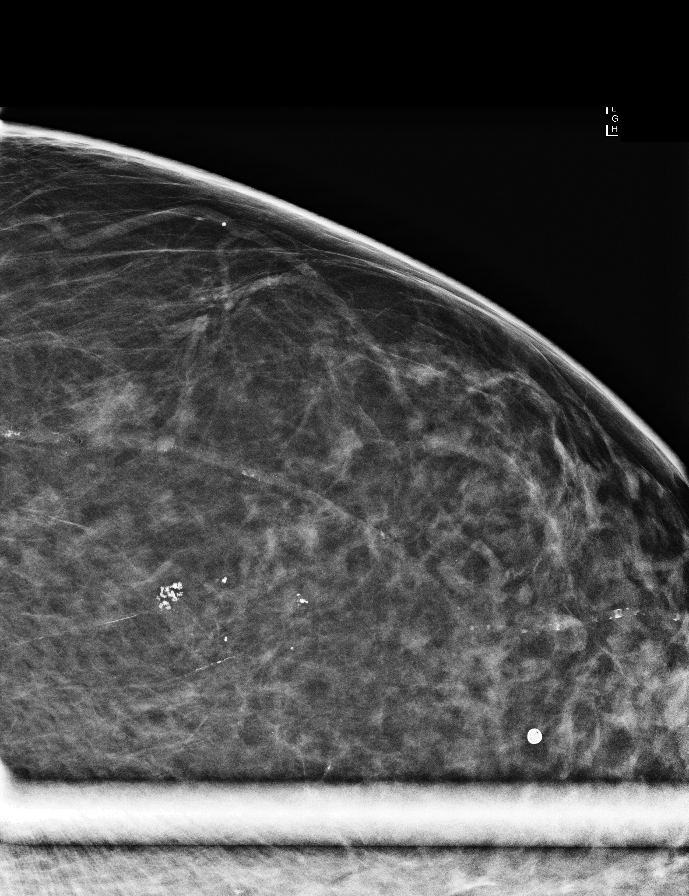

[R CC synth-2D]
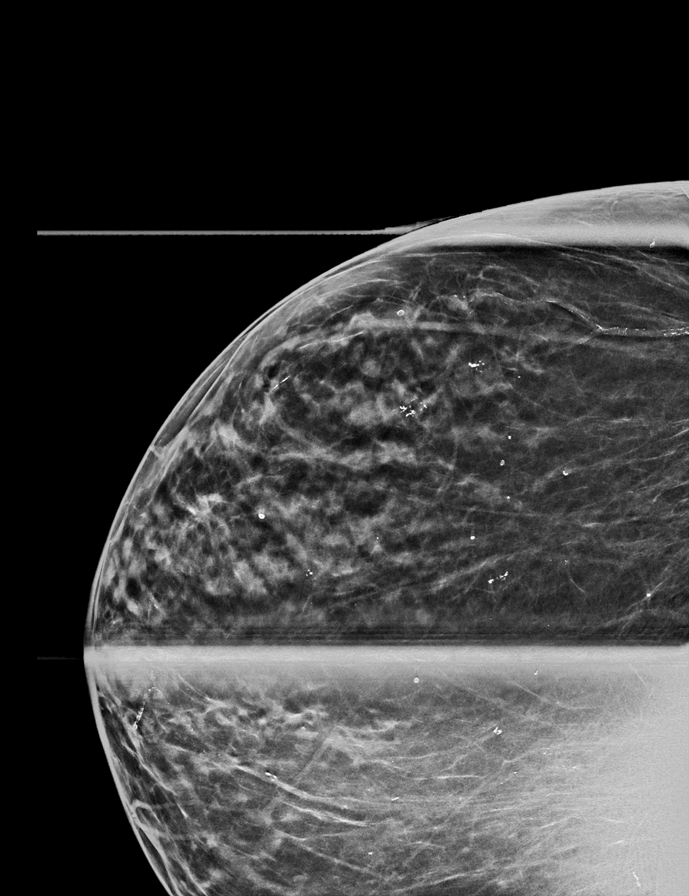

[R MLO synth-2D]
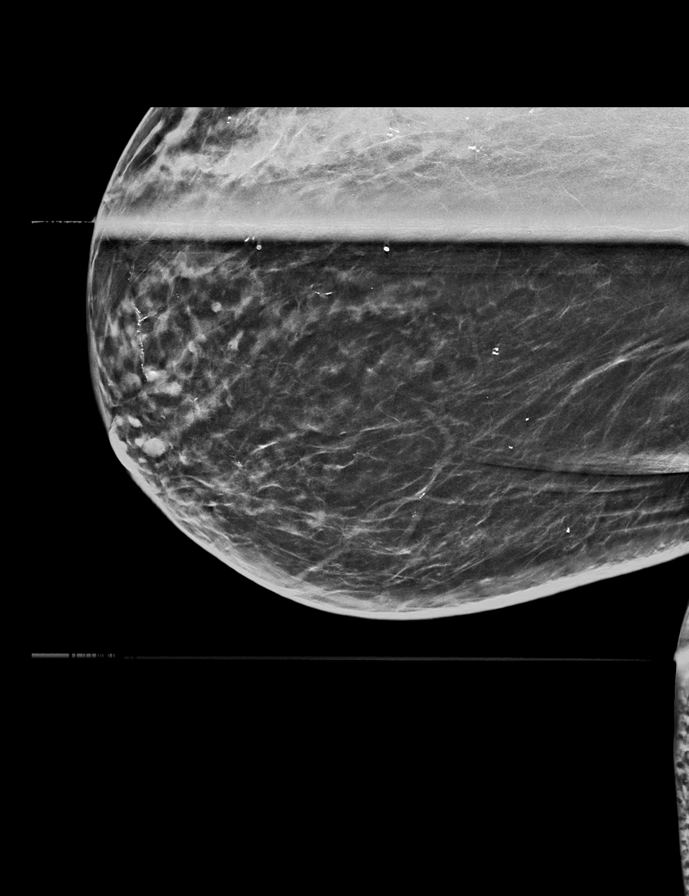

[8 of 24 positions shown; findings below may reference images not displayed]

ACR Breast Density Category b: There are scattered areas of
fibroglandular density.
FINDINGS: Lateral views of bilateral breasts, spot magnification cc and
lateral views of bilateral breasts, spot compression right cc and
MLO views are submitted. Previously questioned asymmetry in the
central right breast is less prominent on spot compression views.
There are indeterminate microcalcifications upper-outer quadrant
right breast measuring 2.9 x 3.5 x 2.1 cm. There are probable coarse
benign appearing calcifications and probable vascular calcifications
in the upper-outer quadrant left breast.
IMPRESSION: Suspicious findings.

RECOMMENDATION:
Recommend stereotactic core biopsies of upper-outer quadrant right
breast calcifications, anterior and posterior groups. If the right
breast calcifications are benign on pathology, six-month follow-up
mammogram is recommended for right breast asymmetry and left breast
calcifications.

I have discussed the findings and recommendations with the patient.
If applicable, a reminder letter will be sent to the patient
regarding the next appointment.

BI-RADS CATEGORY  4: Suspicious.

## 2022-10-14 ENCOUNTER — Other Ambulatory Visit: Payer: Self-pay

## 2022-10-14 DIAGNOSIS — R920 Mammographic microcalcification found on diagnostic imaging of breast: Secondary | ICD-10-CM

## 2022-10-19 ENCOUNTER — Ambulatory Visit: Payer: Self-pay

## 2022-10-19 ENCOUNTER — Other Ambulatory Visit: Payer: Self-pay

## 2022-10-19 ENCOUNTER — Inpatient Hospital Stay: Admission: RE | Admit: 2022-10-19 | Payer: Self-pay | Source: Ambulatory Visit

## 2022-11-15 LAB — EXTERNAL GENERIC LAB PROCEDURE: COLOGUARD: NEGATIVE

## 2023-02-22 ENCOUNTER — Ambulatory Visit
Admission: RE | Admit: 2023-02-22 | Discharge: 2023-02-22 | Disposition: A | Discharge status: Home / Self Care | Payer: Self-pay | Source: Ambulatory Visit | Attending: Obstetrics and Gynecology | Admitting: Obstetrics and Gynecology

## 2023-02-22 ENCOUNTER — Ambulatory Visit: Payer: Self-pay | Attending: Hematology and Oncology | Admitting: Hematology and Oncology

## 2023-02-22 VITALS — BP 130/76 | Wt 183.5 lb

## 2023-02-22 DIAGNOSIS — R920 Mammographic microcalcification found on diagnostic imaging of breast: Secondary | ICD-10-CM

## 2023-02-22 NOTE — Progress Notes (Signed)
Denise Mathis is a 61 y.o. female who presents to [facility] clinic today with no complaints. Follow up previous microcalcifications noted in right breast.   Pap Smear: Pap smear not completed today due to hysterectomy.  Physical exam: Breasts Breasts symmetrical. No skin abnormalities bilateral breasts. No nipple retraction bilateral breasts. No nipple discharge bilateral breasts. No lymphadenopathy. No lumps palpated bilateral breasts.  MS DIGITAL DIAG TOMO BILAT  Result Date: 10/29/2020 CLINICAL DATA:  Callback from screening mammogram for bilateral breast calcifications and right breast asymmetry. EXAM: DIGITAL DIAGNOSTIC BILATERAL MAMMOGRAM WITH TOMOSYNTHESIS AND CAD TECHNIQUE: Bilateral digital diagnostic mammography and breast tomosynthesis was performed. The images were evaluated with computer-aided detection. COMPARISON:  Prior films ACR Breast Density Category b: There are scattered areas of fibroglandular density. FINDINGS: Lateral views of bilateral breasts, spot magnification cc and lateral views of bilateral breasts, spot compression right cc and MLO views are submitted. Previously questioned asymmetry in the central right breast is less prominent on spot compression views. There are indeterminate microcalcifications upper-outer quadrant right breast measuring 2.9 x 3.5 x 2.1 cm. There are probable coarse benign appearing calcifications and probable vascular calcifications in the upper-outer quadrant left breast. IMPRESSION: Suspicious findings. RECOMMENDATION: Recommend stereotactic core biopsies of upper-outer quadrant right breast calcifications, anterior and posterior groups. If the right breast calcifications are benign on pathology, six-month follow-up mammogram is recommended for right breast asymmetry and left breast calcifications. I have discussed the findings and recommendations with the patient. If applicable, a reminder letter will be sent to the patient regarding the next  appointment. BI-RADS CATEGORY  4: Suspicious. Electronically Signed   By: Sherian Rein M.D.   On: 10/29/2020 15:21  MS DIGITAL SCREENING TOMO BILATERAL  Result Date: 10/18/2020 CLINICAL DATA:  Screening. EXAM: DIGITAL SCREENING BILATERAL MAMMOGRAM WITH TOMOSYNTHESIS AND CAD TECHNIQUE: Bilateral screening digital craniocaudal and mediolateral oblique mammograms were obtained. Bilateral screening digital breast tomosynthesis was performed. The images were evaluated with computer-aided detection. COMPARISON:  Previous exam(s). ACR Breast Density Category c: The breast tissue is heterogeneously dense, which may obscure small masses. FINDINGS: In the right breast asymmetries and calcifications require further evaluation. In the left breast calcifications requires further evaluation. IMPRESSION: Further evaluation is suggested for possible asymmetry and calcifications in the right breast. Further evaluation is suggested for possible calcifications in the left breast. RECOMMENDATION: Diagnostic mammogram and possibly ultrasound of both breasts. (Code:[id number]) The patient will be contacted regarding the findings, and additional imaging will be scheduled. BI-RADS CATEGORY  0: Incomplete. Need additional imaging evaluation and/or prior mammograms for comparison. Electronically Signed   By: Annia Belt M.D.   On: 10/18/2020 12:59        Pelvic/Bimanual Pap is not indicated today    Smoking History: Patient has never smoked and was not referred to quit line.    Patient Navigation: Patient education provided. Access to services provided for patient through [facility] program. No interpreter provided. No transportation provided   Colorectal Cancer Screening: Per patient has never had colonoscopy completed No complaints today. Cologuard negative 12/2022 per Phineas Real.   Breast and Cervical Cancer Risk Assessment: Patient has family history of breast cancer, with maternal aunt, paternal grandmother, and second  maternal cousin. Patient does not have history of cervical dysplasia, immunocompromised, or DES exposure in-utero.  Risk Scores as of Encounter on 02/22/2023     Dondra Spry           5-year 1.1%   Lifetime 5.32%  Last calculated by Narda Rutherford, LPN on 82/95/6213 at 12:55 PM           A: [facility] exam without pap smear No complaints with benign exam. Follow up previous microcalcifications in the right breast.  P: Referred patient to the [facility] for a diagnostic mammogram. Appointment scheduled 02/22/23.  Pascal Lux, NP 02/22/2023 12:21 PM

## 2023-02-22 NOTE — Patient Instructions (Addendum)
Taught Denise Mathis about self breast awareness and gave educational materials to take home. Patient did not need a Pap smear today due to hysterectomy. Referred patient to the [facility] for diagnostic mammogram. Appointment scheduled for 02/22/23. Patient aware of appointment and will be there. Let patient know will follow up with her within the next couple weeks with results. Denise Mathis verbalized understanding.  Pascal Lux, NP 12:27 PM
